# Patient Record
Sex: Female | Born: 1975 | Race: Black or African American | Hispanic: No | Marital: Single | Smoking: Never smoker
Health system: Southern US, Community
[De-identification: ages and names within clinical notes are randomized; demographics above are authoritative.]

## PROBLEM LIST (undated history)

## (undated) DIAGNOSIS — E669 Obesity, unspecified: Secondary | ICD-10-CM

## (undated) DIAGNOSIS — I10 Essential (primary) hypertension: Secondary | ICD-10-CM

## (undated) DIAGNOSIS — A048 Other specified bacterial intestinal infections: Secondary | ICD-10-CM

## (undated) DIAGNOSIS — N179 Acute kidney failure, unspecified: Secondary | ICD-10-CM

## (undated) HISTORY — PX: ENDOMETRIAL ABLATION W/ NOVASURE: SUR434

---

## 1998-09-11 ENCOUNTER — Encounter: Admission: RE | Admit: 1998-09-11 | Discharge: 1998-12-10 | Payer: Self-pay | Admitting: Family Medicine

## 1999-02-05 ENCOUNTER — Other Ambulatory Visit: Admission: RE | Admit: 1999-02-05 | Discharge: 1999-02-05 | Payer: Self-pay | Admitting: Obstetrics and Gynecology

## 2000-03-28 ENCOUNTER — Other Ambulatory Visit: Admission: RE | Admit: 2000-03-28 | Discharge: 2000-03-28 | Payer: Self-pay | Admitting: *Deleted

## 2000-08-29 HISTORY — PX: TUBAL LIGATION: SHX77

## 2000-11-12 ENCOUNTER — Inpatient Hospital Stay (HOSPITAL_COMMUNITY): Admission: AD | Admit: 2000-11-12 | Discharge: 2000-11-12 | Payer: Self-pay | Admitting: Obstetrics and Gynecology

## 2000-11-17 ENCOUNTER — Inpatient Hospital Stay (HOSPITAL_COMMUNITY): Admission: AD | Admit: 2000-11-17 | Discharge: 2000-11-17 | Payer: Self-pay | Admitting: Obstetrics and Gynecology

## 2000-11-19 ENCOUNTER — Observation Stay (HOSPITAL_COMMUNITY): Admission: AD | Admit: 2000-11-19 | Discharge: 2000-11-20 | Payer: Self-pay | Admitting: Obstetrics and Gynecology

## 2000-12-03 ENCOUNTER — Inpatient Hospital Stay (HOSPITAL_COMMUNITY): Admission: AD | Admit: 2000-12-03 | Discharge: 2000-12-05 | Payer: Self-pay | Admitting: Obstetrics and Gynecology

## 2001-01-10 ENCOUNTER — Other Ambulatory Visit: Admission: RE | Admit: 2001-01-10 | Discharge: 2001-01-10 | Payer: Self-pay | Admitting: Obstetrics and Gynecology

## 2001-01-30 ENCOUNTER — Ambulatory Visit (HOSPITAL_COMMUNITY): Admission: RE | Admit: 2001-01-30 | Discharge: 2001-01-30 | Payer: Self-pay | Admitting: Obstetrics and Gynecology

## 2002-01-17 ENCOUNTER — Other Ambulatory Visit: Admission: RE | Admit: 2002-01-17 | Discharge: 2002-01-17 | Payer: Self-pay | Admitting: Obstetrics and Gynecology

## 2003-02-21 ENCOUNTER — Other Ambulatory Visit: Admission: RE | Admit: 2003-02-21 | Discharge: 2003-02-21 | Payer: Self-pay | Admitting: Obstetrics and Gynecology

## 2005-04-20 ENCOUNTER — Ambulatory Visit (HOSPITAL_COMMUNITY): Admission: RE | Admit: 2005-04-20 | Discharge: 2005-04-20 | Payer: Self-pay | Admitting: Obstetrics and Gynecology

## 2005-04-20 ENCOUNTER — Encounter (INDEPENDENT_AMBULATORY_CARE_PROVIDER_SITE_OTHER): Payer: Self-pay | Admitting: Specialist

## 2010-02-26 ENCOUNTER — Encounter: Admission: RE | Admit: 2010-02-26 | Discharge: 2010-02-26 | Payer: Self-pay | Admitting: Family Medicine

## 2011-01-14 NOTE — Op Note (Signed)
[facility]  Patient:    [name] [name], [name] [name]                         MRN: [id number] Proc. Date: 01/30/01 Adm. Date:  [id number] Attending:  Lenoard Aden                           Operative Report  PREOPERATIVE DIAGNOSIS:       Desire for elective sterilization.  POSTOPERATIVE DIAGNOSIS:      Desire for elective sterilization.  OPERATION:                    Laparoscopic tubal ligation.  SURGEON:                      Lenoard Aden, M.D.  ASSISTANT:  ANESTHESIA:                   General anesthesia.  ESTIMATED BLOOD LOSS:         Less than 50 cc.  COMPLICATIONS:                None.  DRAINS:                       None.  COUNTS:                       Correct.  The patient to the recovery room in good condition.  FINDINGS:                     Normal size uterus, bilateral normal tubes and ovaries.  Normal liver and gallbladder.  DESCRIPTION OF PROCEDURE:     After being apprised of the risks of anesthesia, infection, bleeding, injury to abdominal organs with need for repair, failure rate of tubal ligation of 5 to 10 per 1000, and delayed versus immediate complications to include bowel and bladder injury, the patient was brought to the operating room where she was administered general anesthesia without complications.  She was prepped and draped in the usual sterile fashion.  Feet are placed in the yellow fin stirrups.  Examination under anesthesia reveals a small anteflexed uterus, no adnexal masses, Hulka tenaculum then placed per vagina.  Infraumbilical incision made with the scalpel after dilute Marcaine solution 10 cc placed.  Veress needle placed.  Opening pressure of -1 noted. Hanging drop test consistent with intraperitoneal entry.  5 liters of CO2 insufflated without difficulty after setting patient pressure to 25.  Trocar placed, atraumatic trocar entry visualized.  Pictures taken.  Visualization reveals normal tubes and ovaries as  noted above.  The patients pressure then reset to 15.  Kleppinger bipolar cautery entered after right tube traced out to the fimbriated end, cauterized down to resistance of 0 using bipolar cautery in three contiguous portions of the ampullary isthmic portion of the tube.  The same procedure was done on the left tube.  Both tubes divided using hook scissors.  Tubal lumens visualized.  Pictures taken.  Good hemostasis achieved.  CO2 released.  Trocar removed under direct visualization.  Incision closed using 0 Vicryl for deep suture.  Good hemostasis noted.  The instruments were removed from the vagina.  The patient tolerated the procedure well and was transferred to the recovery room in good condition. DD:  01/30/01 TD:  01/30/01 Job: [id number]  [id number]

## 2011-01-14 NOTE — Op Note (Signed)
NAMECORRENA, [name] [name]                ACCOUNT NO.:  [id number]   MEDICAL RECORD NO.:  [id number]          PATIENT TYPE:  AMB   LOCATION:  SDC                           FACILITY:  [facility]   PHYSICIAN:  Lenoard Aden, M.D.DATE OF BIRTH:  1975/10/12   DATE OF PROCEDURE:  04/20/2005  DATE OF DISCHARGE:                                 OPERATIVE REPORT   PREOPERATIVE DIAGNOSIS:  Dysmenorrhea and menorrhagia.   POSTOPERATIVE DIAGNOSIS:  Dysmenorrhea and menorrhagia.   PROCEDURE:  Diagnostic hysteroscopy and D&C, NovaSure endometrial ablation.   SURGEON:  Lenoard Aden, M.D.   ANESTHESIA:  General.   ESTIMATED BLOOD LOSS:  Less than 50 mL.   FLUID DEFICIT:  Less than 50 mL.   COMPLICATIONS:  None.   SPECIMENS:  Endometrial curettings.   DISPOSITION:  The patient to the recovery room in good condition.   DESCRIPTION OF PROCEDURE:  After being apprised of risks of anesthesia,  infection, bleeding, uterine perforation, and possible need for repair, the  patient was brought to the operating room where she was administered general  anesthesia without complications, and prepped and draped in the usual  sterile fashion, and catheterized until the bladder was empty.  At this  time, examination under anesthesia reveals an anteflexed, bulky uterus with  no adnexal masses.  Uterus sounds to 8 cm with cervical length of 2.5 cm.  Cavity length noted.  Hysteroscope placed after dilating up to #29 [facility]  dilator and visualization reveals some thickening of the endometrium on the  posterior wall of the cavity, bilateral normal tubal ostia.  At this time  D&C is performed with sharp endometrial curet.  Curettings are collected and  sent to pathology.  Rehysteroscopy reveals no evidence of thickened  endometrium, no evidence of focal lesions.  At this time, NovaSure ablation  is initiated for 77 seconds after the device is properly seated and a CO2  seal is checked.  At this time, good hemostasis  is noted.  The device is  removed.  Revisualization reveals a well-ablated endometrial cavity with no  evidence of perforation.  The patient tolerated the procedure well and is  awakened and transferred to recovery in good condition.     Lenoard Aden, M.D.  Electronically Signed    RJT/MEDQ  D:  04/20/2005  T:  04/20/2005  Job:  [id number]

## 2011-01-14 NOTE — H&P (Signed)
[facility]  Patient:    [name] [name], [name] [name]                         MRN: [id number] Adm. Date:  [id number] Attending:  Lenoard Aden CC:         [facility]   History and Physical  CHIEF COMPLAINT:              Chronic hypertension, presumed LGA.  HISTORY OF PRESENT ILLNESS:   The patient is a 35 year old black female, G3, P1, EDD of December 14, 2000, at 38+ weeks, with chronic hypertension on labetalol and estimated fetal weight greater than 90% percentile.  ALLERGIES:                    No known drug allergies.  MEDICATIONS:                  Labetalol and prenatal vitamins.  OBSTETRICAL HISTORY:          Uncomplicated 6-pound 9-ounce female born in 1995, and spontaneous pregnancy loss in 1997.  PAST MEDICAL HISTORY:         History of wisdom tooth removal.  No other medical or surgical hospitalization.  FAMILY HISTORY:               Diabetes, hypertension.  The patient does have a history of sickle cell trait.  Normal urinalysis. Father of the baby has a normal hemoglobin electrophoresis.  PRENATAL LABORATORY DATA:     Blood type O positive, Rh antibody negative. Rubella immune.  Hepatitis B surface antigen negative.  HIV nonreactive.  Pregnancy complicated by chronic hypertension, well controlled on labetalol, with normal interval growth.  Normal baseline 24-hour urine and CBC and PIH laboratories.  PHYSICAL EXAMINATION:  GENERAL:                      Well-developed, well-nourished black female in no apparent distress.  HEENT:                        Normal.  LUNGS:                        Clear.  HEART:                        Regular rhythm.  ABDOMEN:                      Soft, gravid, nontender.  Estimated fetal weight 8 pounds.  PELVIC:                       Cervix is 1 cm, 50%, vertex, -1.  Artificial rupture of membranes clear.  EXTREMITIES:                  DTRs 2+.  No evidence of clonus.  NEUROLOGIC:                    Intact.  IMPRESSION:                   1. A 38-1/2-week intrauterine pregnancy.                               2. Chronic hypertension.  3. Presumed large for gestational age.  PLAN:                         Proceed with Pitocin induction.  Watch blood pressure closely.  Check CBC, PIH laboratories.  Anticipate attempted vaginal delivery. DD:  12/03/00 TD:  12/03/00 Job: [id number]

## 2011-01-14 NOTE — H&P (Signed)
[facility]  Patient:    [name] [name], [name] [name]                         MRN: [id number] Adm. Date:  [id number] Disc. Date: [id number] Attending:  Lenoard Aden CC:         [facility]   History and Physical  CHIEF COMPLAINT:              Desire for elective sterilization.  HISTORY OF PRESENT ILLNESS:   The patient is a 35 year old, black female, G3, P2, status post a vaginal delivery in April of 2002, who presents for desirous of elective sterilization.  MEDICATIONS:                  Lotensin.  ALLERGIES:                    She has no known drug allergies.  OBSTETRICAL HISTORY:          Remarkable for two vaginal deliveries and one spontaneous pregnancy loss in 1997.  PAST MEDICAL HISTORY:         Her past medical history is remarkable for wisdom tooth removal and hypertension.  The patient also has a history of sickle cell trait.  FAMILY HISTORY:               Family history of diabetes and hypertension.  PRENATAL LABORATORY DATA:     Her blood type is O positive.  She had a pregnancy complicated by a well-controlled chronic hypertension.  SOCIAL HISTORY:               Noncontributory.  She is a non smoker, non drinker.  She denies domestic or physical violence.  PHYSICAL EXAMINATION:  GENERAL:                      On physical examination she is an obese, black female, in no apparent distress.  HEENT:                        Normal.  LUNGS:                        Clear.  HEART:                        Regular rate and rhythm.  ABDOMEN:                      Soft, obese, and nontender.  PELVIC:                       Examination reveals a small anteflexed uterus and no adnexal masses.  IMPRESSION:                   1. Chronic hypertension.                               2. Desirous of elective sterilization.  PLAN:                         Proceed with laparoscopic tubal sterilization. Risks of anesthesia, infection, bleeding, injury to  abdominal organs, need for repair is discussed.  Delayed versus immediate complications to include bowel or bladder injury  noted.  The patient acknowledges and desires to proceed. DD:  01/30/01 TD:  01/30/01 Job: [id number]

## 2011-01-14 NOTE — H&P (Signed)
[facility]  Patient:    [name] [name], [name] [name]                         MRN: [id number] Adm. Date:  [id number] Attending:  Lenoard Aden CC:         [facility]   History and Physical  CHIEF COMPLAINT:              Chronic hypertension with exacerbation, prodromal labor, and presumed large for gestational age at term.  HISTORY OF PRESENT ILLNESS:   Patient is a 35 year old black female G3, P1, EDD of December 14, 2000 at [redacted] weeks gestation who presents with afore mentioned issues.  MEDICATIONS:                  Labetalol 100 mg b.i.d.  ALLERGIES:                    No known drug allergies.  PAST MEDICAL HISTORY:         History of hypertension, wisdom tooth removal. No other medical or surgical hospitalizations.  PAST OBSTETRICAL HISTORY:     Uncomplicated vaginal delivery 6 pound 9 ounce female in 1995.  Spontaneous pregnancy loss at 6 weeks in 1997 without D&C.  PRENATAL LABORATORIES:        Blood type O+.  Rh antibody negative.  Rubella immune.  Hepatitis B surface antigen negative.  HIV nonreactive.  Sickle cell trait positive.  Father of the baby is negative.                                Patient has had reassuring surveillance in the form of twice weekly NSTs and normal growth on ultrasounds.  Most recent ultrasound performed on October 18, 2000 with estimated fetal weight in the 82nd percentile and borderline polyhydramnios.  PHYSICAL EXAMINATION  GENERAL:                      She is a well-developed, well-nourished black female in no apparent distress.  HEENT:                        Normal.  LUNGS:                        Clear.  HEART:                        Regular rate and rhythm.  ABDOMEN:                      Soft, gravid, and nontender.  PELVIC:                       Cervix is closed, 2.5 cm long, soft, vertex, and -2.  EXTREMITIES:                  DTRs 2+.  No evidence of clonus.  No cords are palpable.  LABORATORIES:                  CBC, PIH laboratories within normal limits.  IMPRESSION:                   1. Chronic hypertension at term.  2. Presumed large for gestational age.                               3. Multiple episodes of prodromal labor.  PLAN:                         Proceed with cervical ripening induction.  Risks of anesthesia, infection, bleeding, injury to abdominal organs, need for repair is discussed.  Possibility of failure of induction due to unfavorable cervix is discussed with the patient today.  We will reassess after attempts at cervical ripening and make determinations as long as her blood pressure and laboratory values are normal.  We will consider serial induction as needed. DD:  11/19/00 TD:  11/19/00 Job: [id number]

## 2011-01-14 NOTE — H&P (Signed)
[name] [name], [name] [name]                ACCOUNT NO.:  [id number]   MEDICAL RECORD NO.:  [id number]          PATIENT TYPE:  AMB   LOCATION:  SDC                           FACILITY:  [facility]   PHYSICIAN:  Lenoard Aden, M.D.DATE OF BIRTH:  03/18/1976   DATE OF ADMISSION:  04/20/2005  DATE OF DISCHARGE:                                HISTORY & PHYSICAL   CHIEF COMPLAINT:  Menorrhagia and dysmenorrhea.   HISTORY OF PRESENT ILLNESS:  The patient is a 35 year old African-American  female, G3, P2 status post vaginal delivery x2, status post tubal ligation  in 2002, he presents for management of dysmenorrhea and menorrhagia.   MEDICATIONS:  Antihypertensives in the form of Hyzaar.   She also has a history of LEEP in early 2006. She has a history of an  abnormal Pap smear with benign findings on follow-up and recommendations for  repeat Pap smear pending. She has no known drug allergies.   FAMILY HISTORY:  Contributory for diabetes and hypertension. She also has a  medical history for sickle cell trait and a surgical history of a wisdom  tooth removal and medical history of hypertension.   SOCIAL HISTORY:  Noncontributory. She is a nonsmoker and nondrinker. She  denies domestic and physical violence.   PHYSICAL EXAMINATION:  GENERAL:  She is a well-developed, well-nourished  African-American female in no acute distress.  HEENT:  Normal.  LUNGS:  Clear.  HEART:  Regular rate and rhythm.  ABDOMEN:  Soft and nontender.  PELVIC:  Reveals an anteflexed uterus and no adnexal masses.  EXTREMITIES:  No cords.  NEUROLOGICAL:  Nonfocal.   IMPRESSION:  Dysmenorrhea and menorrhagia for surgical therapy.   PLAN:  Proceed with diagnostic hysteroscopy, endometrial ablation, D&C.  Risks of anesthesia, infection, bleeding, injury to abdominal organs, and  need for repair was discussed. Delayed versus immediate complications to  include bowel and bladder injury are noted. The patient acknowledges and  wishes to proceed.      Lenoard Aden, M.D.  Electronically Signed     RJT/MEDQ  D:  04/19/2005  T:  04/20/2005  Job:  [id number]   cc:   Ma Hillock

## 2014-06-29 DIAGNOSIS — A048 Other specified bacterial intestinal infections: Secondary | ICD-10-CM

## 2014-06-29 HISTORY — DX: Other specified bacterial intestinal infections: A04.8

## 2014-06-29 HISTORY — PX: LAPAROSCOPIC GASTRIC SLEEVE RESECTION: SHX5895

## 2014-08-29 HISTORY — PX: ESOPHAGOGASTRODUODENOSCOPY: SHX1529

## 2014-09-29 DIAGNOSIS — N179 Acute kidney failure, unspecified: Secondary | ICD-10-CM

## 2014-09-29 HISTORY — DX: Acute kidney failure, unspecified: N17.9

## 2014-11-23 ENCOUNTER — Emergency Department (HOSPITAL_COMMUNITY): Payer: BLUE CROSS/BLUE SHIELD

## 2014-11-23 ENCOUNTER — Emergency Department (HOSPITAL_COMMUNITY)
Admission: EM | Admit: 2014-11-23 | Discharge: 2014-11-23 | Disposition: A | Discharge status: Home / Self Care | Payer: BLUE CROSS/BLUE SHIELD | Attending: Emergency Medicine | Admitting: Emergency Medicine

## 2014-11-23 ENCOUNTER — Encounter (HOSPITAL_COMMUNITY): Payer: Self-pay | Admitting: Emergency Medicine

## 2014-11-23 DIAGNOSIS — Z880 Allergy status to penicillin: Secondary | ICD-10-CM | POA: Diagnosis not present

## 2014-11-23 DIAGNOSIS — R000 Tachycardia, unspecified: Secondary | ICD-10-CM | POA: Insufficient documentation

## 2014-11-23 DIAGNOSIS — Z3202 Encounter for pregnancy test, result negative: Secondary | ICD-10-CM | POA: Diagnosis not present

## 2014-11-23 DIAGNOSIS — E860 Dehydration: Secondary | ICD-10-CM | POA: Insufficient documentation

## 2014-11-23 DIAGNOSIS — R110 Nausea: Secondary | ICD-10-CM | POA: Diagnosis present

## 2014-11-23 DIAGNOSIS — R531 Weakness: Secondary | ICD-10-CM

## 2014-11-23 DIAGNOSIS — Z79899 Other long term (current) drug therapy: Secondary | ICD-10-CM | POA: Insufficient documentation

## 2014-11-23 DIAGNOSIS — I10 Essential (primary) hypertension: Secondary | ICD-10-CM | POA: Insufficient documentation

## 2014-11-23 HISTORY — DX: Essential (primary) hypertension: I10

## 2014-11-23 LAB — RAPID URINE DRUG SCREEN, HOSP PERFORMED
AMPHETAMINES: NOT DETECTED
BARBITURATES: NOT DETECTED
BENZODIAZEPINES: NOT DETECTED
Cocaine: NOT DETECTED
Opiates: NOT DETECTED
Tetrahydrocannabinol: NOT DETECTED

## 2014-11-23 LAB — URINALYSIS, ROUTINE W REFLEX MICROSCOPIC
BILIRUBIN URINE: NEGATIVE
GLUCOSE, UA: NEGATIVE mg/dL
Hgb urine dipstick: NEGATIVE
KETONES UR: NEGATIVE mg/dL
LEUKOCYTES UA: NEGATIVE
Nitrite: NEGATIVE
PROTEIN: NEGATIVE mg/dL
Specific Gravity, Urine: 1.015 (ref 1.005–1.030)
UROBILINOGEN UA: 1 mg/dL (ref 0.0–1.0)
pH: 6 (ref 5.0–8.0)

## 2014-11-23 LAB — I-STAT CHEM 8, ED
BUN: 29 mg/dL — AB (ref 6–23)
CHLORIDE: 107 mmol/L (ref 96–112)
CREATININE: 0.8 mg/dL (ref 0.50–1.10)
Calcium, Ion: 1.2 mmol/L (ref 1.12–1.23)
GLUCOSE: 106 mg/dL — AB (ref 70–99)
HCT: 44 % (ref 36.0–46.0)
HEMOGLOBIN: 15 g/dL (ref 12.0–15.0)
POTASSIUM: 3.2 mmol/L — AB (ref 3.5–5.1)
SODIUM: 149 mmol/L — AB (ref 135–145)
TCO2: 27 mmol/L (ref 0–100)

## 2014-11-23 LAB — POC URINE PREG, ED: PREG TEST UR: NEGATIVE

## 2014-11-23 LAB — I-STAT TROPONIN, ED: Troponin i, poc: 0.01 ng/mL (ref 0.00–0.08)

## 2014-11-23 LAB — BASIC METABOLIC PANEL
ANION GAP: 11 (ref 5–15)
BUN: 24 mg/dL — ABNORMAL HIGH (ref 6–23)
CALCIUM: 9.9 mg/dL (ref 8.4–10.5)
CO2: 28 mmol/L (ref 19–32)
CREATININE: 0.76 mg/dL (ref 0.50–1.10)
Chloride: 108 mmol/L (ref 96–112)
Glucose, Bld: 114 mg/dL — ABNORMAL HIGH (ref 70–99)
POTASSIUM: 2.8 mmol/L — AB (ref 3.5–5.1)
Sodium: 147 mmol/L — ABNORMAL HIGH (ref 135–145)

## 2014-11-23 LAB — DIFFERENTIAL
Basophils Absolute: 0.1 10*3/uL (ref 0.0–0.1)
Basophils Relative: 1 % (ref 0–1)
Eosinophils Absolute: 0.1 10*3/uL (ref 0.0–0.7)
Eosinophils Relative: 1 % (ref 0–5)
LYMPHS ABS: 3.2 10*3/uL (ref 0.7–4.0)
LYMPHS PCT: 33 % (ref 12–46)
MONOS PCT: 12 % (ref 3–12)
Monocytes Absolute: 1.1 10*3/uL — ABNORMAL HIGH (ref 0.1–1.0)
Neutro Abs: 5.2 10*3/uL (ref 1.7–7.7)
Neutrophils Relative %: 53 % (ref 43–77)

## 2014-11-23 LAB — CBC
HEMATOCRIT: 39.8 % (ref 36.0–46.0)
HEMOGLOBIN: 13.5 g/dL (ref 12.0–15.0)
MCH: 27.8 pg (ref 26.0–34.0)
MCHC: 33.9 g/dL (ref 30.0–36.0)
MCV: 82.1 fL (ref 78.0–100.0)
PLATELETS: 294 10*3/uL (ref 150–400)
RBC: 4.85 MIL/uL (ref 3.87–5.11)
RDW: 16.7 % — AB (ref 11.5–15.5)
WBC: 9.5 10*3/uL (ref 4.0–10.5)

## 2014-11-23 LAB — ETHANOL

## 2014-11-23 LAB — PROTIME-INR
INR: 1.13 (ref 0.00–1.49)
PROTHROMBIN TIME: 14.6 s (ref 11.6–15.2)

## 2014-11-23 LAB — LIPASE, BLOOD: LIPASE: 55 U/L (ref 11–59)

## 2014-11-23 LAB — APTT: aPTT: 34 seconds (ref 24–37)

## 2014-11-23 MED ORDER — POTASSIUM CHLORIDE CRYS ER 20 MEQ PO TBCR
40.0000 meq | EXTENDED_RELEASE_TABLET | Freq: Once | ORAL | Status: AC
  Administered 2014-11-23: 40 meq via ORAL
  Filled 2014-11-23: qty 2

## 2014-11-23 MED ORDER — SODIUM CHLORIDE 0.9 % IV BOLUS (SEPSIS)
1000.0000 mL | Freq: Once | INTRAVENOUS | Status: AC
  Administered 2014-11-23: 1000 mL via INTRAVENOUS

## 2014-11-23 MED ORDER — POTASSIUM CHLORIDE CRYS ER 20 MEQ PO TBCR: 20.0000 meq | EXTENDED_RELEASE_TABLET | Freq: Two times a day (BID) | ORAL | Status: DC

## 2014-11-23 MED ORDER — ONDANSETRON HCL 8 MG PO TABS: 8.0000 mg | ORAL_TABLET | Freq: Three times a day (TID) | ORAL | Status: DC | PRN

## 2014-11-23 NOTE — ED Notes (Signed)
Pt c/o weakness and nausea x several months.  Pt states that she has been seeing a doctor and her "numbers were off".  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  December is when things started happening.  Has been through CT scans, endoscopies, colonoscopies, etc.  Has been told that nothing is wrong.

## 2014-11-23 NOTE — ED Provider Notes (Signed)
CSN: [id number]     Arrival date & time 11/23/14  1615 History   First MD Initiated Contact with Patient 11/23/14 1635     Chief Complaint  Patient presents with  . Weakness  . Nausea     (Consider location/radiation/quality/duration/timing/severity/associated sxs/prior Treatment) HPI History is obtained from patient and from patient's husband. Patient has been nauseated and with diminished oral intake since having had gastric bypass surgery November 2015. She presents today as she has become progressively more weak  And somewhat  confusedover the past 2 days. She denies pain anywhere. She is not nauseated presently. She denies abdominal pain. Denies headache. No chest pain. No other associated symptoms. [facility] department [facility]/22/2014, and released. Her husband reports that almost fell as she was leaving the hospital. No other treatment prior to coming here. Past Medical History  Diagnosis Date  . Hypertension    Past Surgical History  Procedure Laterality Date  . Laparoscopic gastric sleeve resection     History reviewed. No pertinent family history. History  Substance Use Topics  . Smoking status: Never Smoker   . Smokeless tobacco: Not on file  . Alcohol Use: No   OB History    No data available     Review of Systems  Gastrointestinal: Positive for nausea.  Neurological: Positive for weakness.       Generalized weakness      Allergies  Levaquin and Penicillins  Home Medications   Prior to Admission medications   Medication Sig Start Date End Date Taking? Authorizing Provider  losartan-hydrochlorothiazide (HYZAAR) 50-12.5 MG per tablet Take 1 tablet by mouth daily. 11/01/14  Yes Historical Provider, MD   BP 153/115 mmHg  Pulse 124  Temp(Src) 98.2 F (36.8 C) (Oral)  Resp 23  SpO2 99% Physical Exam  Constitutional: She is oriented to person, place, and time. She appears well-developed and well-nourished.  HENT:  Head: Normocephalic  and atraumatic.  Eyes: Conjunctivae are normal. Pupils are equal, round, and reactive to light.  Neck: Neck supple. No tracheal deviation present. No thyromegaly present.  Cardiovascular: Regular rhythm.   No murmur heard. Mildly tachycardic  Pulmonary/Chest: Effort normal and breath sounds normal.  Abdominal: Soft. Bowel sounds are normal. She exhibits no distension. There is no tenderness.  Musculoskeletal: Normal range of motion. She exhibits no edema or tenderness.  Neurological: She is alert and oriented to person, place, and time. She has normal reflexes. No cranial nerve deficit. Coordination normal.  Not lightheaded on standing. Gait is broad-based and unsteady  Skin: Skin is warm and dry. No rash noted.  Psychiatric: She has a normal mood and affect.  Nursing note and vitals reviewed.   ED Course  Procedures (including critical care time) Labs Review Labs Reviewed  CBC  BASIC METABOLIC PANEL  POC URINE PREG, ED    Imaging Review No results found.   EKG Interpretation   Date/Time:  Sunday November 23 2014 16:43:22 EDT Ventricular Rate:  103 PR Interval:  137 QRS Duration: 68 QT Interval:  410 QTC Calculation: 537 R Axis:   0 Text Interpretation:  Sinus tachycardia Borderline T abnormalities,  anterior leads Prolonged QT interval No old tracing to compare Confirmed  by Gentry, Matthew ([id number]) on 11/23/2014 4:45:35 PM     19 55 PM patient's gait is normal and she is alert and oriented 3 Glasgow Coma Score 15 she complains of mild lightheadedness on standing.  22:45 PM patient asymptomatic after having been treated with 2 L  of normal saline intravenously. She ate part of a meal in the emergency department. Results for orders placed or performed during the hospital encounter of 11/23/14  CBC  (at [facility])  Result Value Ref Range   WBC 9.5 4.0 - 10.5 K/uL   RBC 4.85 3.87 - 5.11 MIL/uL   Hemoglobin 13.5 12.0 - 15.0 g/dL   HCT 16.1 09.6 - 04.5 %   MCV  82.1 78.0 - 100.0 fL   MCH 27.8 26.0 - 34.0 pg   MCHC 33.9 30.0 - 36.0 g/dL   RDW 40.9 (H) 81.1 - 91.4 %   Platelets 294 150 - 400 K/uL  Basic metabolic panel  (at [facility])  Result Value Ref Range   Sodium 147 (H) 135 - 145 mmol/L   Potassium 2.8 (L) 3.5 - 5.1 mmol/L   Chloride 108 96 - 112 mmol/L   CO2 28 19 - 32 mmol/L   Glucose, Bld 114 (H) 70 - 99 mg/dL   BUN 24 (H) 6 - 23 mg/dL   Creatinine, Ser 7.82 0.50 - 1.10 mg/dL   Calcium 9.9 8.4 - 95.6 mg/dL   GFR calc non Af Amer >90 >90 mL/min   GFR calc Af Amer >90 >90 mL/min   Anion gap 11 5 - 15  Ethanol  Result Value Ref Range   Alcohol, Ethyl (B) <5 0 - 9 mg/dL  Protime-INR  Result Value Ref Range   Prothrombin Time 14.6 11.6 - 15.2 seconds   INR 1.13 0.00 - 1.49  APTT  Result Value Ref Range   aPTT 34 24 - 37 seconds  Urine Drug Screen  Result Value Ref Range   Opiates NONE DETECTED NONE DETECTED   Cocaine NONE DETECTED NONE DETECTED   Benzodiazepines NONE DETECTED NONE DETECTED   Amphetamines NONE DETECTED NONE DETECTED   Tetrahydrocannabinol NONE DETECTED NONE DETECTED   Barbiturates NONE DETECTED NONE DETECTED  Urinalysis, Routine w reflex microscopic  Result Value Ref Range   Color, Urine YELLOW YELLOW   APPearance CLEAR CLEAR   Specific Gravity, Urine 1.015 1.005 - 1.030   pH 6.0 5.0 - 8.0   Glucose, UA NEGATIVE NEGATIVE mg/dL   Hgb urine dipstick NEGATIVE NEGATIVE   Bilirubin Urine NEGATIVE NEGATIVE   Ketones, ur NEGATIVE NEGATIVE mg/dL   Protein, ur NEGATIVE NEGATIVE mg/dL   Urobilinogen, UA 1.0 0.0 - 1.0 mg/dL   Nitrite NEGATIVE NEGATIVE   Leukocytes, UA NEGATIVE NEGATIVE  Lipase, blood  Result Value Ref Range   Lipase 55 11 - 59 U/L  Differential  Result Value Ref Range   Neutrophils Relative % 53 43 - 77 %   Neutro Abs 5.2 1.7 - 7.7 K/uL   Lymphocytes Relative 33 12 - 46 %   Lymphs Abs 3.2 0.7 - 4.0 K/uL   Monocytes Relative 12 3 - 12 %   Monocytes Absolute 1.1 (H) 0.1 - 1.0 K/uL    Eosinophils Relative 1 0 - 5 %   Eosinophils Absolute 0.1 0.0 - 0.7 K/uL   Basophils Relative 1 0 - 1 %   Basophils Absolute 0.1 0.0 - 0.1 K/uL  POC Urine Preg, ED (if patient is pre-menopausal female) NOT at [facility]  Result Value Ref Range   Preg Test, Ur NEGATIVE NEGATIVE  I-Stat Chem 8, ED  Result Value Ref Range   Sodium 149 (H) 135 - 145 mmol/L   Potassium 3.2 (L) 3.5 - 5.1 mmol/L   Chloride 107 96 - 112 mmol/L  BUN 29 (H) 6 - 23 mg/dL   Creatinine, Ser 7.84 0.50 - 1.10 mg/dL   Glucose, Bld 696 (H) 70 - 99 mg/dL   Calcium, Ion 2.95 1.12 - 1.23 mmol/L   TCO2 27 0 - 100 mmol/L   Hemoglobin 15.0 12.0 - 15.0 g/dL   HCT 28.4 13.2 - 44.0 %  I-Stat Troponin, ED (not at [facility])  Result Value Ref Range   Troponin i, poc 0.01 0.00 - 0.08 ng/mL   Comment 3           Mr Brain Wo Contrast  11/23/2014   CLINICAL DATA:  Weakness and nausea of several months duration.  EXAM: MRI HEAD WITHOUT CONTRAST  TECHNIQUE: Multiplanar, multiecho pulse sequences of the brain and surrounding structures were obtained without intravenous contrast.  COMPARISON:  None.  FINDINGS: Diffusion imaging does not show any acute or subacute infarction. The brainstem and cerebellum are normal. The cerebral hemispheres are normal except for a few punctate foci of T2 and FLAIR signal in the subcortical white matter, not likely significant. No cortical or large vessel territory abnormality. No mass lesion, hemorrhage, hydrocephalus or extra-axial collection. No pituitary mass. No inflammatory sinus disease. No skull or skullbase lesion. Major vessels at the base of the brain show flow.  IMPRESSION: No significant finding. No cause of the presenting symptoms is identified. Normal study except for a few punctate white matter foci not likely of clinical relevance.   Electronically Signed   By: Paulina Fusi M.D.   On: 11/23/2014 19:08    MDM  MRI scan is not consistent with stroke. Gait improved after treatment with intravenous  hydration. Clinically patient mildly dehydrated and that she has decreased oral intake. Patient encouraged to drink 4 ounces of Gatorade every 4 hours. She'll be given a prescription for K-Dur and zofran She is to follow-up with Dr.Andy this week. Suggest blood pressure recheck and serum potassium recheck Diagnosis #1 dehydration #2 hypokalemia #3 hypernatremia #4 hypertension  Final diagnoses:  None        Doug Sou, MD 11/23/14 2251

## 2014-11-23 NOTE — Discharge Instructions (Signed)
Dehydration, Adult Drink 4 ounces of Gatorade every 4 hours while awake. Your blood potassium today was slightly low at 3.2. Blood pressure was elevated at 160/102. Call Dr. Mardelle MatteAndy tomorrow to arrange to be seen in the office this week. Your blood potassium and blood pressure should be rechecked. Dehydration means your body does not have as much fluid as it needs. Your kidneys, brain, and heart will not work properly without the right amount of fluids and salt.  HOME CARE  Ask your doctor how to replace body fluid losses (rehydrate).  Drink enough fluids to keep your pee (urine) clear or pale yellow.  Drink small amounts of fluids often if you feel sick to your stomach (nauseous) or throw up (vomit).  Eat like you normally do.  Avoid:  Foods or drinks high in sugar.  Bubbly (carbonated) drinks.  Juice.  Very hot or cold fluids.  Drinks with caffeine.  Fatty, greasy foods.  Alcohol.  Tobacco.  Eating too much.  Gelatin desserts.  Wash your hands to avoid spreading germs (bacteria, viruses).  Only take medicine as told by your doctor.  Keep all doctor visits as told. GET HELP RIGHT AWAY IF:   You cannot drink something without throwing up.  You get worse even with treatment.  Your vomit has blood in it or looks greenish.  Your poop (stool) has blood in it or looks black and tarry.  You have not peed in 6 to 8 hours.  You pee a small amount of very dark pee.  You have a fever.  You pass out (faint).  You have belly (abdominal) pain that gets worse or stays in one spot (localizes).  You have a rash, stiff neck, or bad headache.  You get easily annoyed, sleepy, or are hard to wake up.  You feel weak, dizzy, or very thirsty. MAKE SURE YOU:   Understand these instructions.  Will watch your condition.  Will get help right away if you are not doing well or get worse. Document Released: 06/11/2009 Document Revised: 11/07/2011 Document Reviewed:  04/04/2011 [facility] Patient Information 2015 [place], [place]. This information is not intended to replace advice given to you by your health care provider. Make sure you discuss any questions you have with your health care provider.

## 2014-11-23 NOTE — ED Notes (Signed)
Patient aware of sample needed.  

## 2014-11-23 NOTE — ED Notes (Signed)
Patient transported to MRI 

## 2014-11-26 ENCOUNTER — Inpatient Hospital Stay (HOSPITAL_COMMUNITY)
Admission: EM | Admit: 2014-11-26 | Discharge: 2014-12-08 | DRG: 091 | Disposition: A | Discharge status: Skilled Nursing Facility | Payer: BLUE CROSS/BLUE SHIELD | Attending: Internal Medicine | Admitting: Internal Medicine

## 2014-11-26 ENCOUNTER — Emergency Department (HOSPITAL_COMMUNITY): Payer: BLUE CROSS/BLUE SHIELD

## 2014-11-26 ENCOUNTER — Encounter (HOSPITAL_COMMUNITY): Payer: Self-pay

## 2014-11-26 DIAGNOSIS — R627 Adult failure to thrive: Secondary | ICD-10-CM | POA: Diagnosis present

## 2014-11-26 DIAGNOSIS — F4323 Adjustment disorder with mixed anxiety and depressed mood: Secondary | ICD-10-CM | POA: Diagnosis present

## 2014-11-26 DIAGNOSIS — K828 Other specified diseases of gallbladder: Secondary | ICD-10-CM | POA: Diagnosis present

## 2014-11-26 DIAGNOSIS — K5900 Constipation, unspecified: Secondary | ICD-10-CM | POA: Diagnosis present

## 2014-11-26 DIAGNOSIS — R7989 Other specified abnormal findings of blood chemistry: Secondary | ICD-10-CM

## 2014-11-26 DIAGNOSIS — R410 Disorientation, unspecified: Secondary | ICD-10-CM | POA: Insufficient documentation

## 2014-11-26 DIAGNOSIS — I10 Essential (primary) hypertension: Secondary | ICD-10-CM | POA: Diagnosis present

## 2014-11-26 DIAGNOSIS — E876 Hypokalemia: Secondary | ICD-10-CM | POA: Diagnosis present

## 2014-11-26 DIAGNOSIS — F419 Anxiety disorder, unspecified: Secondary | ICD-10-CM

## 2014-11-26 DIAGNOSIS — Z881 Allergy status to other antibiotic agents status: Secondary | ICD-10-CM | POA: Diagnosis not present

## 2014-11-26 DIAGNOSIS — G9340 Encephalopathy, unspecified: Secondary | ICD-10-CM | POA: Diagnosis present

## 2014-11-26 DIAGNOSIS — R52 Pain, unspecified: Secondary | ICD-10-CM

## 2014-11-26 DIAGNOSIS — R945 Abnormal results of liver function studies: Secondary | ICD-10-CM

## 2014-11-26 DIAGNOSIS — G4700 Insomnia, unspecified: Secondary | ICD-10-CM | POA: Diagnosis present

## 2014-11-26 DIAGNOSIS — Z9884 Bariatric surgery status: Secondary | ICD-10-CM

## 2014-11-26 DIAGNOSIS — G629 Polyneuropathy, unspecified: Secondary | ICD-10-CM | POA: Diagnosis present

## 2014-11-26 DIAGNOSIS — R531 Weakness: Secondary | ICD-10-CM

## 2014-11-26 DIAGNOSIS — R270 Ataxia, unspecified: Secondary | ICD-10-CM | POA: Diagnosis not present

## 2014-11-26 DIAGNOSIS — K8020 Calculus of gallbladder without cholecystitis without obstruction: Secondary | ICD-10-CM | POA: Diagnosis present

## 2014-11-26 DIAGNOSIS — Z903 Acquired absence of stomach [part of]: Secondary | ICD-10-CM | POA: Diagnosis present

## 2014-11-26 DIAGNOSIS — E669 Obesity, unspecified: Secondary | ICD-10-CM

## 2014-11-26 DIAGNOSIS — Z880 Allergy status to penicillin: Secondary | ICD-10-CM | POA: Diagnosis not present

## 2014-11-26 DIAGNOSIS — K819 Cholecystitis, unspecified: Secondary | ICD-10-CM

## 2014-11-26 DIAGNOSIS — R29898 Other symptoms and signs involving the musculoskeletal system: Secondary | ICD-10-CM

## 2014-11-26 DIAGNOSIS — K8012 Calculus of gallbladder with acute and chronic cholecystitis without obstruction: Secondary | ICD-10-CM

## 2014-11-26 DIAGNOSIS — R000 Tachycardia, unspecified: Secondary | ICD-10-CM | POA: Insufficient documentation

## 2014-11-26 HISTORY — DX: Other specified bacterial intestinal infections: A04.8

## 2014-11-26 HISTORY — DX: Obesity, unspecified: E66.9

## 2014-11-26 HISTORY — DX: Acute kidney failure, unspecified: N17.9

## 2014-11-26 LAB — CBC WITH DIFFERENTIAL/PLATELET
Basophils Absolute: 0 10*3/uL (ref 0.0–0.1)
Basophils Relative: 0 % (ref 0–1)
EOS ABS: 0.1 10*3/uL (ref 0.0–0.7)
EOS PCT: 2 % (ref 0–5)
HCT: 38.6 % (ref 36.0–46.0)
Hemoglobin: 13.1 g/dL (ref 12.0–15.0)
LYMPHS PCT: 27 % (ref 12–46)
Lymphs Abs: 1.9 10*3/uL (ref 0.7–4.0)
MCH: 27.7 pg (ref 26.0–34.0)
MCHC: 33.9 g/dL (ref 30.0–36.0)
MCV: 81.6 fL (ref 78.0–100.0)
Monocytes Absolute: 0.8 10*3/uL (ref 0.1–1.0)
Monocytes Relative: 11 % (ref 3–12)
NEUTROS PCT: 60 % (ref 43–77)
Neutro Abs: 4.2 10*3/uL (ref 1.7–7.7)
PLATELETS: 274 10*3/uL (ref 150–400)
RBC: 4.73 MIL/uL (ref 3.87–5.11)
RDW: 17.1 % — ABNORMAL HIGH (ref 11.5–15.5)
WBC: 6.9 10*3/uL (ref 4.0–10.5)

## 2014-11-26 LAB — COMPREHENSIVE METABOLIC PANEL
ALT: 520 U/L — ABNORMAL HIGH (ref 0–35)
ANION GAP: 6 (ref 5–15)
AST: 223 U/L — AB (ref 0–37)
Albumin: 3.4 g/dL — ABNORMAL LOW (ref 3.5–5.2)
Alkaline Phosphatase: 223 U/L — ABNORMAL HIGH (ref 39–117)
BILIRUBIN TOTAL: 1.8 mg/dL — AB (ref 0.3–1.2)
BUN: 17 mg/dL (ref 6–23)
CALCIUM: 9.8 mg/dL (ref 8.4–10.5)
CHLORIDE: 107 mmol/L (ref 96–112)
CO2: 32 mmol/L (ref 19–32)
Creatinine, Ser: 0.88 mg/dL (ref 0.50–1.10)
GFR, EST NON AFRICAN AMERICAN: 81 mL/min — AB (ref >90)
GLUCOSE: 110 mg/dL — AB (ref 70–99)
Potassium: 3.2 mmol/L — ABNORMAL LOW (ref 3.5–5.1)
SODIUM: 145 mmol/L (ref 135–145)
Total Protein: 7.5 g/dL (ref 6.0–8.3)

## 2014-11-26 LAB — LIPASE, BLOOD: Lipase: 48 U/L (ref 11–59)

## 2014-11-26 MED ORDER — SODIUM CHLORIDE 0.9 % IV BOLUS (SEPSIS)
1000.0000 mL | Freq: Once | INTRAVENOUS | Status: AC
  Administered 2014-11-26: 1000 mL via INTRAVENOUS

## 2014-11-26 MED ORDER — SODIUM CHLORIDE 0.9 % IV SOLN
INTRAVENOUS | Status: DC
  Administered 2014-11-26: 18:00:00 via INTRAVENOUS
  Administered 2014-11-27: 1000 mL via INTRAVENOUS
  Administered 2014-11-27: 04:00:00 via INTRAVENOUS

## 2014-11-26 MED ORDER — POTASSIUM CHLORIDE 10 MEQ/100ML IV SOLN
10.0000 meq | Freq: Once | INTRAVENOUS | Status: AC
  Administered 2014-11-26: 10 meq via INTRAVENOUS
  Filled 2014-11-26: qty 100

## 2014-11-26 MED ORDER — MORPHINE SULFATE 2 MG/ML IJ SOLN: 2.0000 mg | INTRAMUSCULAR | Status: DC | PRN

## 2014-11-26 MED ORDER — LOSARTAN POTASSIUM-HCTZ 50-12.5 MG PO TABS: 1.0000 | ORAL_TABLET | Freq: Every day | ORAL | Status: DC

## 2014-11-26 MED ORDER — IOHEXOL 300 MG/ML  SOLN: 25.0000 mL | Freq: Once | INTRAMUSCULAR | Status: AC | PRN

## 2014-11-26 MED ORDER — SODIUM CHLORIDE 0.9 % IV BOLUS (SEPSIS)
500.0000 mL | Freq: Once | INTRAVENOUS | Status: AC
  Administered 2014-11-26: 500 mL via INTRAVENOUS

## 2014-11-26 MED ORDER — ONDANSETRON HCL 4 MG PO TABS: 4.0000 mg | ORAL_TABLET | Freq: Four times a day (QID) | ORAL | Status: DC | PRN

## 2014-11-26 MED ORDER — HYDROCHLOROTHIAZIDE 12.5 MG PO CAPS
12.5000 mg | ORAL_CAPSULE | Freq: Every day | ORAL | Status: DC
Start: 2014-11-26 — End: 2014-11-27
  Administered 2014-11-26 – 2014-11-27 (×2): 12.5 mg via ORAL
  Filled 2014-11-26 (×2): qty 1

## 2014-11-26 MED ORDER — HEPARIN SODIUM (PORCINE) 5000 UNIT/ML IJ SOLN
5000.0000 [IU] | Freq: Three times a day (TID) | INTRAMUSCULAR | Status: DC
  Administered 2014-11-26 – 2014-12-07 (×30): 5000 [IU] via SUBCUTANEOUS
  Filled 2014-11-26 (×37): qty 1

## 2014-11-26 MED ORDER — ONDANSETRON HCL 4 MG/2ML IJ SOLN
4.0000 mg | Freq: Four times a day (QID) | INTRAMUSCULAR | Status: DC | PRN
Start: 2014-11-26 — End: 2014-12-08
  Administered 2014-12-02 – 2014-12-07 (×2): 4 mg via INTRAVENOUS
  Filled 2014-11-26 (×2): qty 2

## 2014-11-26 MED ORDER — LABETALOL HCL 5 MG/ML IV SOLN
20.0000 mg | Freq: Once | INTRAVENOUS | Status: AC
  Administered 2014-11-26: 20 mg via INTRAVENOUS
  Filled 2014-11-26: qty 4

## 2014-11-26 MED ORDER — LOSARTAN POTASSIUM 50 MG PO TABS
50.0000 mg | ORAL_TABLET | Freq: Every day | ORAL | Status: DC
  Administered 2014-11-26 – 2014-12-08 (×13): 50 mg via ORAL
  Filled 2014-11-26 (×13): qty 1

## 2014-11-26 NOTE — ED Notes (Signed)
Dr. zammit aware of high BP, no new orders

## 2014-11-26 NOTE — ED Notes (Signed)
Pt ambulated with unsteady gait with assistance.

## 2014-11-26 NOTE — Progress Notes (Signed)
Pt admitted to the unit at 1750. Pt mental status is A&Ox4. Pt oriented to room, staff, and call bell. Skin is intact. Full assessment charted in [facility]. Call bell within reach. Visitor guidelines reviewed w/ pt and/or family.

## 2014-11-26 NOTE — ED Provider Notes (Signed)
CSN: [id number]     Arrival date & time 11/26/14  1013 History   First MD Initiated Contact with Patient 11/26/14 1018     Chief Complaint  Patient presents with  . Fatigue     (Consider location/radiation/quality/duration/timing/severity/associated sxs/prior Treatment) Patient is a 39 y.o. female presenting with weakness. The history is provided by the patient (the pt complains of weakness for months and ataxia for one week).  Weakness This is a new problem. The current episode started more than 1 week ago. The problem occurs constantly. The problem has not changed since onset.Pertinent negatives include no chest pain, no abdominal pain and no headaches. Nothing aggravates the symptoms. Nothing relieves the symptoms.    Past Medical History  Diagnosis Date  . Hypertension    Past Surgical History  Procedure Laterality Date  . Laparoscopic gastric sleeve resection     History reviewed. No pertinent family history. History  Substance Use Topics  . Smoking status: Never Smoker   . Smokeless tobacco: Not on file  . Alcohol Use: No   OB History    No data available     Review of Systems  Constitutional: Negative for appetite change and fatigue.  HENT: Negative for congestion, ear discharge and sinus pressure.   Eyes: Negative for discharge.  Respiratory: Negative for cough.   Cardiovascular: Negative for chest pain.  Gastrointestinal: Negative for abdominal pain and diarrhea.  Genitourinary: Negative for frequency and hematuria.  Musculoskeletal: Negative for back pain.  Skin: Negative for rash.  Neurological: Positive for weakness. Negative for seizures and headaches.       Ataxia  Psychiatric/Behavioral: Negative for hallucinations.      Allergies  Levaquin and Penicillins  Home Medications   Prior to Admission medications   Medication Sig Start Date End Date Taking? Authorizing Provider  losartan-hydrochlorothiazide (HYZAAR) 50-12.5 MG per tablet Take 1 tablet  by mouth daily. 11/01/14  Yes Historical Provider, MD  ondansetron (ZOFRAN) 8 MG tablet Take 1 tablet (8 mg total) by mouth every 8 (eight) hours as needed for nausea. Patient not taking: Reported on 11/26/2014 11/23/14   Doug Sou, MD  potassium chloride SA (K-DUR,KLOR-CON) 20 MEQ tablet Take 1 tablet (20 mEq total) by mouth 2 (two) times daily. Patient not taking: Reported on 11/26/2014 11/23/14   Doug Sou, MD   BP 137/101 mmHg  Pulse 91  Temp(Src) 98.3 F (36.8 C) (Oral)  Resp 16  SpO2 100%  LMP  (LMP Unknown) Physical Exam  Constitutional: She is oriented to person, place, and time. She appears well-developed.  HENT:  Head: Normocephalic.  Eyes: Conjunctivae and EOM are normal. No scleral icterus.  Neck: Neck supple. No thyromegaly present.  Cardiovascular: Normal rate and regular rhythm.  Exam reveals no gallop and no friction rub.   No murmur heard. Pulmonary/Chest: No stridor. She has no wheezes. She has no rales. She exhibits no tenderness.  Abdominal: She exhibits no distension. There is no tenderness. There is no rebound.  Musculoskeletal: Normal range of motion. She exhibits no edema.  Pt is moderate ataxia  Lymphadenopathy:    She has no cervical adenopathy.  Neurological: She is oriented to person, place, and time. She exhibits normal muscle tone. Coordination normal.  Skin: No rash noted. No erythema.  Psychiatric: She has a normal mood and affect. Her behavior is normal.    ED Course  Procedures (including critical care time) Labs Review Labs Reviewed  CBC WITH DIFFERENTIAL/PLATELET - Abnormal; Notable for the following:  RDW 17.1 (*)    All other components within normal limits  COMPREHENSIVE METABOLIC PANEL - Abnormal; Notable for the following:    Potassium 3.2 (*)    Glucose, Bld 110 (*)    Albumin 3.4 (*)    AST 223 (*)    ALT 520 (*)    Alkaline Phosphatase 223 (*)    Total Bilirubin 1.8 (*)    GFR calc non Af Amer 81 (*)    All other  components within normal limits  LIPASE, BLOOD    Imaging Review Mr Lumbar Spine Wo Contrast  11/26/2014   CLINICAL DATA:  Bilateral leg weakness.  Weakness for 3 months.  EXAM: MRI LUMBAR SPINE WITHOUT CONTRAST  TECHNIQUE: Multiplanar, multisequence MR imaging of the lumbar spine was performed. No intravenous contrast was administered.  COMPARISON:  None.  FINDINGS: The vertebral bodies of the lumbar spine are normal in size. The vertebral bodies of the lumbar spine are normal in alignment. There is normal bone marrow signal demonstrated throughout the vertebra. The intervertebral disc spaces are well-maintained.  The spinal cord is normal in signal and contour. The cord terminates normally at L1 . The nerve roots of the cauda equina and the filum terminale are normal.  The visualized portions of the SI joints are unremarkable.  10 mm T2 hyperintense and T1 hypointense right renal mass statistically likely represent a cyst.  T12-L1: No significant disc bulge. No evidence of neural foraminal stenosis. No central canal stenosis.  L1-L2: No significant disc bulge. No evidence of neural foraminal stenosis. No central canal stenosis.  L2-L3: No significant disc bulge. No evidence of neural foraminal stenosis. No central canal stenosis.  L3-L4: No significant disc bulge. No evidence of neural foraminal stenosis. No central canal stenosis.  L4-L5: No significant disc bulge. No evidence of neural foraminal stenosis. No central canal stenosis.  L5-S1: No significant disc bulge. No evidence of neural foraminal stenosis. No central canal stenosis. Mild bilateral facet arthropathy.  IMPRESSION: 1. No significant lumbar spine disc protrusion, foraminal stenosis or central canal stenosis. 2. Mild bilateral facet arthropathy at L5-S1.   Electronically Signed   By: Elige KoHetal  Patel   On: 11/26/2014 16:02   Dg Abd Acute W/chest  11/26/2014   CLINICAL DATA:  Generalized weakness.  EXAM: ACUTE ABDOMEN SERIES (ABDOMEN 2 VIEW &  CHEST 1 VIEW)  COMPARISON:  None.  FINDINGS: There is no evidence of dilated bowel loops or free intraperitoneal air. No radiopaque calculi or other significant radiographic abnormality is seen. Heart size and mediastinal contours are within normal limits. Both lungs are clear.  IMPRESSION: Negative abdominal radiographs.  No acute cardiopulmonary disease.   Electronically Signed   By: Francene BoyersJames  Maxwell M.D.   On: 11/26/2014 11:35     EKG Interpretation None     I spoke with her gi md Dr. Jason FilaBray and he suggested to get a hyda scan with cck for elevated liver studies MDM   Final diagnoses:  Weak  Pain  Leg weakness  Ataxia    Admit for ataxia,  Gi consuleded   Bethann BerkshireJoseph Avon Mergenthaler, MD 11/26/14 1712

## 2014-11-26 NOTE — H&P (Addendum)
[facility] History and Physical  [name] [name] ZOX:[id number] DOB: 03-Apr-1976 DOA: 11/26/2014  Referring physician: Emergency Department PCP: No primary care provider on file.  Specialists:   Chief Complaint: Nausea, elevated LFT  HPI: [name] [name] is a 39 y.o. female  With a hx of morbid obesity s/p gastric bypass in 12/15, HTN, who presents with progressive nausea and weakness. In the ED, pt noted to have difficulty ambulating. She was noted to have increased LFT's: alk phos of 223, alt 520, and ast 223 with bili of 1.8. GI was consulted through the ED and hospitalist consulted for admission.  Review of Systems:  Review of Systems  Constitutional: Positive for weight loss and malaise/fatigue. Negative for fever and chills.  HENT: Negative for ear pain.   Eyes: Negative for pain and discharge.  Respiratory: Negative for hemoptysis and wheezing.   Cardiovascular: Negative for chest pain and palpitations.  Gastrointestinal: Positive for nausea and constipation. Negative for abdominal pain.  Genitourinary: Negative for frequency and flank pain.  Musculoskeletal: Negative for myalgias and joint pain.  Skin: Negative for itching.  Neurological: Positive for weakness. Negative for tremors, seizures, loss of consciousness and headaches.  Psychiatric/Behavioral: Negative for memory loss. The patient does not have insomnia.      Past Medical History  Diagnosis Date  . Hypertension    Past Surgical History  Procedure Laterality Date  . Laparoscopic gastric sleeve resection     Social History:  reports that she has never smoked. She does not have any smokeless tobacco history on file. She reports that she does not drink alcohol or use illicit drugs.  where does patient live--home, ALF, SNF? and with whom if at home?  Can patient participate in ADLs?  Allergies  Allergen Reactions  . Levaquin [Levofloxacin In D5w] Rash  . Penicillins Itching and Rash    Rash on lips/itch     History reviewed. No pertinent family history. multiple family members with HTN (be sure to complete)  Prior to Admission medications   Medication Sig Start Date End Date Taking? Authorizing Provider  losartan-hydrochlorothiazide (HYZAAR) 50-12.5 MG per tablet Take 1 tablet by mouth daily. 11/01/14  Yes Historical Provider, MD  ondansetron (ZOFRAN) 8 MG tablet Take 1 tablet (8 mg total) by mouth every 8 (eight) hours as needed for nausea. Patient not taking: Reported on 11/26/2014 11/23/14   Orlie Dakin, MD  potassium chloride SA (K-DUR,KLOR-CON) 20 MEQ tablet Take 1 tablet (20 mEq total) by mouth 2 (two) times daily. Patient not taking: Reported on 11/26/2014 11/23/14   Orlie Dakin, MD   Physical Exam: Filed Vitals:   11/26/14 1430 11/26/14 1500 11/26/14 1515 11/26/14 1610  BP: 155/118 156/111 155/106 137/101  Pulse: 90 90 88 91  Temp:      TempSrc:      Resp: 21 15 16 16   SpO2: 100% 99% 100% 100%     General:  Awake, in nad  Eyes: PERRL B  ENT: membranes moist, dentition fair  Neck: trachea midline, neck supple  Cardiovascular: regular, s1, s2  Respiratory: normal resp effort, no wheezing  Abdomen: soft,nondistended  Skin: normal skin turgor, no abnormal skin lesions seen  Musculoskeletal: perfused, no clubbing  Psychiatric: flat affect, affect normal  Neurologic: cn2-12 grossly intact, strength/sensation intact  Labs on Admission:  Basic Metabolic Panel:  Recent Labs Lab 11/23/14 1701 11/23/14 1814 11/26/14 1100  NA 147* 149* 145  K 2.8* 3.2* 3.2*  CL 108 107 107  CO2 28  --  32  GLUCOSE 114* 106* 110*  BUN 24* 29* 17  CREATININE 0.76 0.80 0.88  CALCIUM 9.9  --  9.8   Liver Function Tests:  Recent Labs Lab 11/26/14 1100  AST 223*  ALT 520*  ALKPHOS 223*  BILITOT 1.8*  PROT 7.5  ALBUMIN 3.4*    Recent Labs Lab 11/23/14 1809 11/26/14 1100  LIPASE 55 48   No results for input(s): AMMONIA in the last 168 hours. CBC:  Recent  Labs Lab 11/23/14 1701 11/23/14 1814 11/26/14 1100  WBC 9.5  --  6.9  NEUTROABS 5.2  --  4.2  HGB 13.5 15.0 13.1  HCT 39.8 44.0 38.6  MCV 82.1  --  81.6  PLT 294  --  274   Cardiac Enzymes: No results for input(s): CKTOTAL, CKMB, CKMBINDEX, TROPONINI in the last 168 hours.  BNP (last 3 results) No results for input(s): BNP in the last 8760 hours.  ProBNP (last 3 results) No results for input(s): PROBNP in the last 8760 hours.  CBG: No results for input(s): GLUCAP in the last 168 hours.  Radiological Exams on Admission: Mr Lumbar Spine Wo Contrast  11/26/2014   CLINICAL DATA:  Bilateral leg weakness.  Weakness for 3 months.  EXAM: MRI LUMBAR SPINE WITHOUT CONTRAST  TECHNIQUE: Multiplanar, multisequence MR imaging of the lumbar spine was performed. No intravenous contrast was administered.  COMPARISON:  None.  FINDINGS: The vertebral bodies of the lumbar spine are normal in size. The vertebral bodies of the lumbar spine are normal in alignment. There is normal bone marrow signal demonstrated throughout the vertebra. The intervertebral disc spaces are well-maintained.  The spinal cord is normal in signal and contour. The cord terminates normally at L1 . The nerve roots of the cauda equina and the filum terminale are normal.  The visualized portions of the SI joints are unremarkable.  10 mm T2 hyperintense and T1 hypointense right renal mass statistically likely represent a cyst.  T12-L1: No significant disc bulge. No evidence of neural foraminal stenosis. No central canal stenosis.  L1-L2: No significant disc bulge. No evidence of neural foraminal stenosis. No central canal stenosis.  L2-L3: No significant disc bulge. No evidence of neural foraminal stenosis. No central canal stenosis.  L3-L4: No significant disc bulge. No evidence of neural foraminal stenosis. No central canal stenosis.  L4-L5: No significant disc bulge. No evidence of neural foraminal stenosis. No central canal stenosis.   L5-S1: No significant disc bulge. No evidence of neural foraminal stenosis. No central canal stenosis. Mild bilateral facet arthropathy.  IMPRESSION: 1. No significant lumbar spine disc protrusion, foraminal stenosis or central canal stenosis. 2. Mild bilateral facet arthropathy at L5-S1.   Electronically Signed   By: Kathreen Devoid   On: 11/26/2014 16:02   Dg Abd Acute W/chest  11/26/2014   CLINICAL DATA:  Generalized weakness.  EXAM: ACUTE ABDOMEN SERIES (ABDOMEN 2 VIEW & CHEST 1 VIEW)  COMPARISON:  None.  FINDINGS: There is no evidence of dilated bowel loops or free intraperitoneal air. No radiopaque calculi or other significant radiographic abnormality is seen. Heart size and mediastinal contours are within normal limits. Both lungs are clear.  IMPRESSION: Negative abdominal radiographs.  No acute cardiopulmonary disease.   Electronically Signed   By: Lorriane Shire M.D.   On: 11/26/2014 11:35    Assessment/Plan Principal Problem:   Elevated LFTs Active Problems:   Ataxia   Obesity   Anxiety  1. Elevated LFT's 1. Will order HIDA 2. GI consulted through ED  3. Pt does have GB. Discussed with ED staff who discussed with pt's primary GI. Through ED, pt's recent abd [place] and abd CT were unremarkable. 4. Admit to med-surg 2. Weakness/ataxia 1. Will consult PT/OT 2. MRI lumbar spine unremarkable 3. HTN 1. Will continue home meds 4. Obesity 1. S/p gastric bypass 5. Anxiety 1. stable 6. DVT prophylaxis 1. Heparin subQ   Code Status: Full (must indicate code status--if unknown or must be presumed, indicate so) Family Communication: Pt in room (indicate person spoken with, if applicable, with phone number if by telephone) Disposition Plan: [facility] (indicate anticipated LOS)   Citlalic Norlander, [facility] Pager [phone]  If 7PM-7AM, please contact night-coverage [email or web address] Password [email or web address] 11/26/2014, 5:03 PM

## 2014-11-26 NOTE — ED Notes (Addendum)
Per EMS - pt from home. Pt had gastric bypass in January, has lost 100lbs since then. Pt has had generalized weakness x3 months. Pt non-ambulatory majority of the time d/t weakness. PCP states pt is ok. [facility] but wants to see if she could possibly get a referral to another gastroenterologist sooner than her 2nd opinion appointment scheduled for 4/15. BP 162/118, pulse 80, 16 RR, cbg 123, 98% on RA.

## 2014-11-26 NOTE — Progress Notes (Signed)
LCSW was alerted by RN regarding family request to speak to SW about placement for patient. Met with patient, patient husband and patient father. Patient had bariatric surgery in November 2015.  Since surgery patient has decompensated physically as she has lost 100lbs but physically deconditioned, unable to hold liquids and food down and become bed bound.  Husband reports multiple hospital visits with no conclusion as to why patient has responded this way have surgery, especially this long. The biggest concern from husband is how patient can no longer walk.  Reports she has to picked up to go to the bathroom, assistance with all ADLs.    Patient prior to surgery was independent and working.  Currently on FMLA and short term disability.  Works at [employer].  Patient and husband have a 24 year old child who is in the home and strong family/community support.  LCSW gave patient emotional support and discussed using CBT feelings after surgery to rule out depression/anxiety.  Patient reports life is just not what she thought it was going to be after the surgery. Reports feeling overwhelmed, anxious about how much she has to eat, measuring food, and loss of interest with eating as food does not taste the same and cannot eat as much.  LCSW assessed prior food intake and patient reports she loved food and found a lot of comfort in food.  All social gatherings involved food and thus has lost pleasure associated with eating.    Patient would benefit from counseling regarding the transition after her baratric surgery.  LCSW recommended [facility] as she is a patient at that bariatric clinic and LCSW will see if she can schedule/make referral.  Patient agreeable. Patient would also benefit from home health/outpatient physical therapy in effort to motivate her to begin walking, and completing basic tasks.  Family agreeable. Family educated that she does not qualify for a nursing home at this time.  They are  understanding, but husband still struggling to understand the mental aspect of patient's perception of food and how this affects her daily life.  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

## 2014-11-26 NOTE — ED Notes (Signed)
Social work at bedside.  

## 2014-11-27 ENCOUNTER — Inpatient Hospital Stay (HOSPITAL_COMMUNITY): Payer: BLUE CROSS/BLUE SHIELD

## 2014-11-27 ENCOUNTER — Encounter (HOSPITAL_COMMUNITY): Payer: Self-pay | Admitting: Physician Assistant

## 2014-11-27 DIAGNOSIS — F05 Delirium due to known physiological condition: Secondary | ICD-10-CM

## 2014-11-27 DIAGNOSIS — R627 Adult failure to thrive: Secondary | ICD-10-CM

## 2014-11-27 LAB — CBC
HEMATOCRIT: 32.8 % — AB (ref 36.0–46.0)
Hemoglobin: 11.3 g/dL — ABNORMAL LOW (ref 12.0–15.0)
MCH: 28 pg (ref 26.0–34.0)
MCHC: 34.5 g/dL (ref 30.0–36.0)
MCV: 81.4 fL (ref 78.0–100.0)
Platelets: 271 10*3/uL (ref 150–400)
RBC: 4.03 MIL/uL (ref 3.87–5.11)
RDW: 17.5 % — ABNORMAL HIGH (ref 11.5–15.5)
WBC: 7.5 10*3/uL (ref 4.0–10.5)

## 2014-11-27 LAB — COMPREHENSIVE METABOLIC PANEL
ALT: 349 U/L — ABNORMAL HIGH (ref 0–35)
AST: 112 U/L — AB (ref 0–37)
Albumin: 2.9 g/dL — ABNORMAL LOW (ref 3.5–5.2)
Alkaline Phosphatase: 185 U/L — ABNORMAL HIGH (ref 39–117)
Anion gap: 9 (ref 5–15)
BUN: 8 mg/dL (ref 6–23)
CALCIUM: 8.8 mg/dL (ref 8.4–10.5)
CO2: 27 mmol/L (ref 19–32)
CREATININE: 0.77 mg/dL (ref 0.50–1.10)
Chloride: 108 mmol/L (ref 96–112)
GFR calc Af Amer: >90 mL/min (ref >90)
Glucose, Bld: 97 mg/dL (ref 70–99)
Potassium: 2.9 mmol/L — ABNORMAL LOW (ref 3.5–5.1)
Sodium: 144 mmol/L (ref 135–145)
Total Bilirubin: 1.2 mg/dL (ref 0.3–1.2)
Total Protein: 6.3 g/dL (ref 6.0–8.3)

## 2014-11-27 LAB — TSH: TSH: 1.231 u[IU]/mL (ref 0.350–4.500)

## 2014-11-27 LAB — AMMONIA: Ammonia: 31 umol/L (ref 11–32)

## 2014-11-27 MED ORDER — LACTULOSE 10 GM/15ML PO SOLN
20.0000 g | Freq: Three times a day (TID) | ORAL | Status: DC
  Administered 2014-11-27 – 2014-12-08 (×29): 20 g via ORAL
  Filled 2014-11-27 (×36): qty 30

## 2014-11-27 MED ORDER — STERILE WATER FOR INJECTION IJ SOLN
INTRAMUSCULAR | Status: AC
  Filled 2014-11-27: qty 10

## 2014-11-27 MED ORDER — POTASSIUM CHLORIDE 10 MEQ/100ML IV SOLN
10.0000 meq | INTRAVENOUS | Status: AC
  Administered 2014-11-27 (×2): 10 meq via INTRAVENOUS
  Filled 2014-11-27 (×4): qty 100

## 2014-11-27 MED ORDER — PANTOPRAZOLE SODIUM 40 MG IV SOLR
40.0000 mg | INTRAVENOUS | Status: DC
  Filled 2014-11-27: qty 40

## 2014-11-27 MED ORDER — POTASSIUM CHLORIDE 10 MEQ/100ML IV SOLN
10.0000 meq | INTRAVENOUS | Status: AC
  Administered 2014-11-27 (×2): 10 meq via INTRAVENOUS
  Filled 2014-11-27 (×2): qty 100

## 2014-11-27 MED ORDER — TECHNETIUM TC 99M MEBROFENIN IV KIT
5.0000 | PACK | Freq: Once | INTRAVENOUS | Status: AC | PRN
  Administered 2014-11-27: 5 via INTRAVENOUS

## 2014-11-27 MED ORDER — PANTOPRAZOLE SODIUM 40 MG PO TBEC
40.0000 mg | DELAYED_RELEASE_TABLET | Freq: Every day | ORAL | Status: DC
  Administered 2014-11-27: 40 mg via ORAL
  Filled 2014-11-27: qty 1

## 2014-11-27 MED ORDER — ENSURE ENLIVE PO LIQD
237.0000 mL | Freq: Two times a day (BID) | ORAL | Status: DC
  Administered 2014-11-28 (×2): 237 mL via ORAL

## 2014-11-27 MED ORDER — SINCALIDE 5 MCG IJ SOLR
0.0200 ug/kg | Freq: Once | INTRAMUSCULAR | Status: AC
  Administered 2014-11-27: 1.5 ug via INTRAVENOUS
  Filled 2014-11-27: qty 5

## 2014-11-27 MED ORDER — SINCALIDE 5 MCG IJ SOLR
INTRAMUSCULAR | Status: AC
  Administered 2014-11-27: 1.5 ug via INTRAVENOUS
  Filled 2014-11-27: qty 5

## 2014-11-27 MED ORDER — POTASSIUM CHLORIDE IN NACL 20-0.9 MEQ/L-% IV SOLN
INTRAVENOUS | Status: DC
  Administered 2014-11-27: 1000 mL via INTRAVENOUS
  Administered 2014-11-28 (×2): via INTRAVENOUS
  Administered 2014-11-29 – 2014-11-30 (×2): 1000 mL via INTRAVENOUS
  Administered 2014-11-30: 75 mL/h via INTRAVENOUS
  Administered 2014-12-01 – 2014-12-05 (×7): via INTRAVENOUS
  Filled 2014-11-27 (×19): qty 1000

## 2014-11-27 NOTE — Progress Notes (Signed)
Utilization review completed. Brysen Shankman, RN, BSN. 

## 2014-11-27 NOTE — Consult Note (Signed)
[facility] Consult: 10:38 AM 11/27/2014  LOS: 1 day    Referring Provider: Dr Erlinda Hong  Primary Care Physician: [facility]. Dr Billey Chang.    Primary Gastroenterologist:  Dr. Tora Duck Surgeon:  Dr  Heron Nay.    Reason for Consultation:  Abnormal LFTs.    HPI: [name] [name] is a 39 y.o. female.  Morbidly obese pt s/p sleeve gastrectomy in 06/2014.   She is not a good historian.  Pieced together hx from notes in [place].  Issues with post op n/v leading to several radiologic studies outlined below.  Serum H Pylori test + on surgery pathology so this was treated.  S/p EGD (date not clear) showing "gastritis, erosion at the bypass surgery site".  LFTs mildly elevated. Several solid, indeterminate, liver lesions seen on CTs in 09/2014 and 10/2014.  Admitted to [facility] 2/23-2/27 with hypernatremia, hypokalemia, hypercalcemia, metabollic acidosis, ARF, mild elevation transaminases.  AFP and viral hepatitis studies were normal. Nausea described as hard to control, constipation treated with enemas.  Required TPN. At discharge was started on Doxepin, Reglan, prn Zofran, prn Miralax, prn Senokot.  No clear cause for n/v found after multiple studies.  Omeprazole, Carafate continued. Started on course of Diflucan and Nystatin for oral thrush 3/15.  Referred to a psychologist after surgical follow up on 3/16.   Seen 3/26 at [facility] ED for weakness anorexia "not eating", dizziness, making it difficult to walk.  No abdominal pain, no dyspnea.  Treated for dehydration.  Came to [facility] ED 3/30 with same sxs and admitted. ED MD noted ataxia.  Labs with abnormal LFTs.  These are improved today. Potassium 3.2, today 2.9.  BUN/creat normal.  WBCs normal. AAS xrays normal.   LFTs reviewed: 2/25  AST/ALT: 43/74. alk phos 88.  T bili 0.5 2/26 AST/ALT 2/26: 107/125. Alk phos 90.  t bili 0.7.  GGT 226.  2/27 AST/ALT 2/27: 204/240.  Alk phos 101.  t bili 1.08 3/26 AST/ALT 3/26: 29/92.  Alk phos 97.  t bili0.93 3/30 AST/ALT: 223/520.  Alk phos 223.  t bili 1.8. 3/31 AST/ALT: 112/349.  Alk phos 185.  t bili 1.2.    Ultrasound and HIDA scan ordered.   At present pt confused (one example:gave 2015 as year after long period of pondering answer).  She can not recall much of her hx.  She denies abdominal pain, says overall nausea better, just suffers queasiness and is not vomiting.  Says she eats "2 regular meals" per day, no snacking.  Still constipated, about 2 small BMs per week but not using available prn laxatives.   Her complaint is weakness only.  Denies falls. 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  Has no idea how much weight she's lost and difficulty recalling her starting weight. Her records show weight of 244# 06/24/14, current weight is 163#   Previous imaging and relevant GI studies at [facility] EGD, timing not clear as above  3 way abdominal films 11/22/14  For vomiting FINDINGS: Distended bowel loops in the left upper quadrant  of the abdomen likely colon. No other distended bowel loops seen. No pneumoperitoneum. No abdominal masses or calcifications. The heart is normal in size. The lungs are clear. No pleural effusions or pneumothorax. No acute bony abnormality  Brain CT 11/22/14 For altered mental status.  Negative.   CT abd/Pelvis with contrast 10/22/14  INDICATION: Vomiting and constipation COMPARISON: 10/14/2014 FINDINGS: The lung bases are clear. No pleural or pericardial effusions. Redemonstration of several solid liver lesions. No calcified gallstones or CT evidence of cholecystitis. The spleen, pancreas, and adrenal glands are unremarkable. Kidneys are normal except for a small simple right renal cyst. No ureteral stones or hydronephrosis. Gastric  surgery. Urinary bladder is unremarkable. Uterus and adnexa within normal limits. No diverticulitis or colitis. Normal appendix. No bowel obstruction. No ascites or pneumoperitoneum. No adenopathy. No acute fractures or bone destruction.  Gastric emptying study 10/16/2014: INDICATION: K30: Functional dyspepsia FINDINGS: There was initial prompt gastric emptying over 60 minutes. This then plateaus for the next 180 minutes. Gastric emptying at one hour is 73%.  Gastric emptying at 4 hours is 81%.  CT ABD WO W/CONT 10/14/2014 IMPRESSION: Postsurgical changes status post partial gastrectomy. Several liver lesions identified. These lesions appear to enhance  excluding cysts. The imaging characteristics are not definitive for a  particular type of lesion with benign and malignant etiologies remain in  the differential. This could represent lesion such as an etiology such as  small adenomas, atypical hemangiomas, or focal nodular hyperplasia. If  history of malignancy metastasis not entirely excluded. There prior  outside images are available these be helpful to assess for interval  change and stability would be most consistent with benign etiologies.  UGI series 09/24/2014 INDICATION: NAUSEA AND VOMITING SINCE VERTICAL SLEEVE GASTRECTOMY IN NOVEMBER 2015 FINDINGS: Deformity of the stomach consistent with vertical sleeve  gastrectomy. Gastroesophageal junction patent. Duodenal sweep normal.  Esophagus normal. Fluoroscopy time: 138 seconds  IMPRESSION: Partial gastrectomy without evidence of acute complications.     Past Medical History  Diagnosis Date  . Hypertension   . ARF (acute renal failure) 09/2014    admitted with electrolyte disturbance, ARF to [facility].   [facility] Positive H. pylori test 06/2014    serum H pylori + and treated.     Past Surgical History  Procedure Laterality Date  . 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  Endometrial ablation w/ novasure    . Tubal ligation  2002  . Esophagogastroduodenoscopy  2016    Dr Tora Duck of [facility].  found gastritis, erosion at bypass surgery site.     Prior to Admission medications   Medication Sig Start Date End Date Taking? Authorizing Provider  losartan-hydrochlorothiazide (HYZAAR) 50-12.5 MG per tablet Take 1 tablet by mouth daily. 11/01/14  Yes Historical Provider, MD  ondansetron (ZOFRAN) 8 MG tablet Take 1 tablet (8 mg total) by mouth every 8 (eight) hours as needed for nausea. Patient not taking: Reported on 11/26/2014 11/23/14   Orlie Dakin, MD  potassium chloride SA (K-DUR,KLOR-CON) 20 MEQ tablet Take 1 tablet (20 mEq total) by mouth 2 (two) times daily. Patient not taking: Reported on 11/26/2014 11/23/14   Orlie Dakin, MD    Scheduled Meds: . heparin  5,000 Units Subcutaneous 3 times per day  . losartan  50 mg Oral Daily   And  . hydrochlorothiazide  12.5 mg Oral Daily   Infusions: . sodium chloride 100 mL/hr at 11/27/14 0400   PRN  Meds: morphine injection, ondansetron **OR** ondansetron (ZOFRAN) IV   Allergies as of 11/26/2014 - Review Complete 11/26/2014  Allergen Reaction Noted  . Levaquin [levofloxacin in d5w] Rash 11/23/2014  . Penicillins Itching and Rash 11/23/2014    History reviewed. No pertinent family history.  History   Social History  . Marital Status: Married    Spouse Name: N/A  . Number of Children: N/A  . Years of Education: N/A   Occupational History  . Not on file.   Social History Main Topics  . Smoking status: Never Smoker   . Smokeless tobacco: Not on file  . Alcohol Use: No  . Drug Use: No  . Sexual Activity: Not on file   Other Topics Concern  . Not on file   Social History Narrative    REVIEW OF SYSTEMS: Constitutional:  weakness ENT:  No nose bleeds Pulm:  No SOB or cough CV:  No palpitations, no LE edema. No chest pain GU:  No hematuria, no frequency GI:  No dysphagia, no  blood in emesis or stool Heme:  No unusual bleeding or bruising   Transfusions:  non Neuro:  No headaches, no peripheral tingling or numbness Derm:  No itching, no rash or sores.  Endocrine:  No sweats or chills.  No polyuria or dysuria Immunization:  Not queried.  Travel:  None beyond local counties in last few months.    PHYSICAL EXAM: Vital signs in last 24 hours: Filed Vitals:   11/27/14 0919  BP: 128/98  Pulse: 88  Temp: 98.4  Resp: 18   Wt Readings from Last 3 Encounters:  11/26/14 163 lb 9.6 oz (74.208 kg)    General: healthy appearing, alert but significant psycomotor/speach retardation.   Head:  No signs of trauma  Eyes:  No icterus or conj pallor Ears:  Not HOH  Nose:  No congestion or discharge Mouth:  Clear bil  Neck:  No mass or JVD Lungs:  Clear bil  Heart: RRR.  No mrg Abdomen:  Soft, healed laparoscopy scars, no mass, no HSM.  BS hypoactive.   Rectal: deferred   Musc/Skeltl: no joint swelling or deformity Extremities:  No CCE  Neurologic:  Oriented to self, hospital but not to year/date or details of her history.  Not able to elicit asterixis.  Limbs without weakness.  Skin:  No rash or telangectasia.  Tattoos:  none Nodes:  No cervical adenopathy   Psych:  Cooperative, relaxed.   Intake/Output from previous day: 03/30 0701 - 03/31 0700 In: 60 [P.O.:60] Out: 1350 [Urine:1350] Intake/Output this shift: Total I/O In: 0  Out: 500 [Urine:500]  LAB RESULTS:  Recent Labs  11/26/14 1100 11/27/14 0656  WBC 6.9 7.5  HGB 13.1 11.3*  HCT 38.6 32.8*  PLT 274 271   BMET Lab Results  Component Value Date   NA 144 11/27/2014   NA 145 11/26/2014   NA 149* 11/23/2014   K 2.9* 11/27/2014   K 3.2* 11/26/2014   K 3.2* 11/23/2014   CL 108 11/27/2014   CL 107 11/26/2014   CL 107 11/23/2014   CO2 27 11/27/2014   CO2 32 11/26/2014   CO2 28 11/23/2014   GLUCOSE 97 11/27/2014   GLUCOSE 110* 11/26/2014   GLUCOSE 106* 11/23/2014   BUN 8 11/27/2014     BUN 17 11/26/2014   BUN 29* 11/23/2014   CREATININE 0.77 11/27/2014   CREATININE 0.88 11/26/2014   CREATININE 0.80 11/23/2014   CALCIUM 8.8 11/27/2014   CALCIUM 9.8  11/26/2014   CALCIUM 9.9 11/23/2014   LFT  Recent Labs  11/26/14 1100 11/27/14 0656  PROT 7.5 6.3  ALBUMIN 3.4* 2.9*  AST 223* 112*  ALT 520* 349*  ALKPHOS 223* 185*  BILITOT 1.8* 1.2   PT/INR Lab Results  Component Value Date   INR 1.13 11/23/2014   Lipase     Component Value Date/Time   LIPASE 48 11/26/2014 1100    Drugs of Abuse     Component Value Date/Time   LABOPIA NONE DETECTED 11/23/2014 2143   COCAINSCRNUR NONE DETECTED 11/23/2014 2143   LABBENZ NONE DETECTED 11/23/2014 2143   AMPHETMU NONE DETECTED 11/23/2014 2143   THCU NONE DETECTED 11/23/2014 2143   LABBARB NONE DETECTED 11/23/2014 2143     RADIOLOGY STUDIES: Mr Lumbar Spine Wo Contrast  11/26/2014   CLINICAL DATA:  Bilateral leg weakness.  Weakness for 3 months.  EXAM: MRI LUMBAR SPINE WITHOUT CONTRAST  TECHNIQUE: Multiplanar, multisequence MR imaging of the lumbar spine was performed. No intravenous contrast was administered.  COMPARISON:  None.  FINDINGS: The vertebral bodies of the lumbar spine are normal in size. The vertebral bodies of the lumbar spine are normal in alignment. There is normal bone marrow signal demonstrated throughout the vertebra. The intervertebral disc spaces are well-maintained.  The spinal cord is normal in signal and contour. The cord terminates normally at L1 . The nerve roots of the cauda equina and the filum terminale are normal.  The visualized portions of the SI joints are unremarkable.  10 mm T2 hyperintense and T1 hypointense right renal mass statistically likely represent a cyst.  T12-L1: No significant disc bulge. No evidence of neural foraminal stenosis. No central canal stenosis.  L1-L2: No significant disc bulge. No evidence of neural foraminal stenosis. No central canal stenosis.  L2-L3: No significant  disc bulge. No evidence of neural foraminal stenosis. No central canal stenosis.  L3-L4: No significant disc bulge. No evidence of neural foraminal stenosis. No central canal stenosis.  L4-L5: No significant disc bulge. No evidence of neural foraminal stenosis. No central canal stenosis.  L5-S1: No significant disc bulge. No evidence of neural foraminal stenosis. No central canal stenosis. Mild bilateral facet arthropathy.  IMPRESSION: 1. No significant lumbar spine disc protrusion, foraminal stenosis or central canal stenosis. 2. Mild bilateral facet arthropathy at L5-S1.   Electronically Signed   By: Kathreen Devoid   On: 11/26/2014 16:02   Dg Abd Acute W/chest  11/26/2014   CLINICAL DATA:  Generalized weakness.  EXAM: ACUTE ABDOMEN SERIES (ABDOMEN 2 VIEW & CHEST 1 VIEW)  COMPARISON:  None.  FINDINGS: There is no evidence of dilated bowel loops or free intraperitoneal air. No radiopaque calculi or other significant radiographic abnormality is seen. Heart size and mediastinal contours are within normal limits. Both lungs are clear.  IMPRESSION: Negative abdominal radiographs.  No acute cardiopulmonary disease.   Electronically Signed   By: Lorriane Shire M.D.   On: 11/26/2014 11:35    ENDOSCOPIC STUDIES: EGD as above   IMPRESSION:   * Abnormal LFTs. No abdominal pain but need to rule out gallstones, biliary pathology. ? Drug reaction (DILI) Stable lesions of liver of unclear diagnosis, seen on CT in Feb and March 2016.  *  Hx morbid obesity.  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 Weight drop of 80 #.  Issues with post op N/V which currently pt claims have improved, however not convinced of this improvement.  Wonder if N/V is due to biliary issues?   *  Weakness  *  Encephalopathy. Concerned about hepatic encephalopathy.   *  Constipation.    PLAN:     *  Ammonia level when she returns from xray.   *  Await findings of ultrasound and HIDA  *  Will add Lactulose for the constipation (and for ?  Hepatic encepholopathy)   *  PPI.    Azucena Freed  11/27/2014, 10:38 AM Pager: [phone]  Addendum 2 PM: Ultrasound: Large volume of sludge within the gallbladder lumen possibly with tiny calculi associated. No gallbladder wall thickening or sonographic Murphy's sign. No biliary tree or liver abnormality. 3 mm CBD.   [facility] Attending  I have also seen and assessed the patient and agree with the advanced practitioner's assessment and plan. HIDA shows decreased GB EF - doubt that is cause of problems.   I am not sure why she has abnormal LFT's Vomiting problems began after sleeve gastrectomy.  Needs further serologic evaluation - has had negative hepatitis A B and C at [facility]. Will do autoimmune and inherited workup here. Her ferritin was not elevated either, globulin not elevated.  She is a bit slow re: mentattion - year 2015, thought she was at [facility] and took a while to answer Obama as president. No asterixis.

## 2014-11-27 NOTE — Evaluation (Signed)
Occupational Therapy Evaluation Patient Details Name: [name] FantasiaChristel Krzywicki MRN: [id number] DOB: 05/20/1976 Today's Date: 11/27/2014    History of Present Illness Pt is a 39 y.o. Female PMH of HTN, obesity, and s/p gastric bypass in 07/2014 admitted 11/26/14 for nausea, elevated LFT, weakness and ataxia.    Clinical Impression   PTA pt lived at home and was independent with ADLs, although she admits increasing difficulty at home due to weakness. Pt had questionable effort during OT session and no family present to determine accuracy of information. Feel that pt would be a fall risk at this time if she returned home and will need to progress to min guard level to return home with family support. Pt will benefit from acute OT for strengthening and ADLs.     Follow Up Recommendations  SNF;Supervision/Assistance - 24 hour    Equipment Recommendations  Other (comment) (TBD)    Recommendations for Other Services Other (comment) (Psych consult?)     Precautions / Restrictions Precautions Precautions: Fall Restrictions Weight Bearing Restrictions: No      Mobility Bed Mobility Overal bed mobility: Needs Assistance Bed Mobility: Supine to Sit;Sit to Supine     Supine to sit: Min guard Sit to supine: Min guard   General bed mobility comments: VC's for sequencing. Minimal effort but able to perform with increased time. No physical assist needed.   Transfers Overall transfer level: Needs assistance Equipment used: 1 person hand held assist Transfers: Sit to/from UGI CorporationStand;Stand Pivot Transfers Sit to Stand: Mod assist Stand pivot transfers: Mod assist       General transfer comment: Mod A to rise due to LE weakness and questionable effort.          ADL Overall ADL's : Needs assistance/impaired Eating/Feeding: NPO Eating/Feeding Details (indicate cue type and reason): NPO for US Grooming: Set up;Sitting   Upper Body Bathing: Set up;Sitting   Lower Body Bathing: Moderate  assistance;Sit to/from stand   Upper Body Dressing : Set up;Sitting   Lower Body Dressing: Maximal assistance;+2 for physical assistance;Sit to/from stand   Toilet Transfer: Moderate assistance;Stand-pivot (hand held assist) Toilet Transfer Details (indicate cue type and reason): bed>recliner>bed           General ADL Comments: Question pt's effort level. Pt able to stand with min A initially, then minimally attempted and stated "I can't stand again." Pt was able to stand x2 with mod A. Pt with flat affect but agreeable to participate in therapy.                Pertinent Vitals/Pain Pain Assessment: No/denies pain     Hand Dominance Right   Extremity/Trunk Assessment Upper Extremity Assessment Upper Extremity Assessment: Generalized weakness (tested 4+/5 throughout)   Lower Extremity Assessment Lower Extremity Assessment: Generalized weakness;Defer to PT evaluation   Cervical / Trunk Assessment Cervical / Trunk Assessment: Normal   Communication Communication Communication: No difficulties   Cognition Arousal/Alertness: Awake/alert Behavior During Therapy: Flat affect Overall Cognitive Status: Within Functional Limits for tasks assessed                                Home Living Family/patient expects to be discharged to:: Private residence Living Arrangements: Spouse/significant other;Children Available Help at Discharge: Family Type of Home: House Home Access: Stairs to enter Secretary/administratorntrance Stairs-Number of Steps: 1 Entrance Stairs-Rails: None Home Layout: One level     Bathroom Shower/Tub: Tub/shower unit Shower/tub characteristics: Engineer, building servicesCurtain Bathroom Toilet: Standard  Home Equipment: None          Prior Functioning/Environment Level of Independence: Independent        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    OT Diagnosis: Generalized weakness   OT Problem List: Decreased strength;Decreased activity tolerance;Impaired balance (sitting and/or  standing)   OT Treatment/Interventions: Self-care/ADL training;Therapeutic exercise;Energy conservation;DME and/or AE instruction;Therapeutic activities;Patient/family education;Balance training    OT Goals(Current goals can be found in the care plan section) Acute Rehab OT Goals Patient Stated Goal: to get stronger OT Goal Formulation: With patient Time For Goal Achievement: 12/11/14 Potential to Achieve Goals: Good ADL Goals Pt Will Perform Lower Body Bathing: with min guard assist;sit to/from stand Pt Will Perform Lower Body Dressing: with min guard assist;sit to/from stand Pt Will Transfer to Toilet: with min guard assist;ambulating Pt Will Perform Toileting - Clothing Manipulation and hygiene: with min guard assist;sit to/from stand  OT Frequency: Min 2X/week    End of Session Equipment Utilized During Treatment: Gait belt  Activity Tolerance: Patient limited by fatigue Patient left: in bed;with call bell/phone within reach;Other (comment) (with transport)   Time: 1035-1100 OT Time Calculation (min): 25 min Charges:  OT General Charges $OT Visit: 1 Procedure OT Evaluation $Initial OT Evaluation Tier I: 1 Procedure OT Treatments $Self Care/Home Management : 8-22 mins G-Codes:    Rae Lips 2014-12-10, 11:47 AM  Carney Living, OTR/L Occupational Therapist [phone] (pager)

## 2014-11-27 NOTE — Progress Notes (Signed)
PROGRESS NOTE  [name] [name] ZOX:[id number] DOB: 25-Nov-1975 DOA: 11/26/2014 PCP: No primary care provider on file.  HPI/Recap of past 24 hours:  Returned from HIDA scan, no active n/v, denies pain, wanting to eat. Husband at bedside.  Assessment/Plan: Principal Problem:   Elevated LFTs Active Problems:   Ataxia   Obesity   Anxiety   Cholelithiasis   Elevation of LFT: unclear etiology, hepatitis panel/ab us/HIDA scan pending. GI consulted.  Hypokalemia: replace k  Confusion: MRI brain no acute finding, ammonia level pending. Will check tsh/b12/folate level.  FTT/progressive weakness: mri brain/spine no acute findings. PT/OT/SNF  Obesity s/p gastric bypass. Reported 80pounds of weight loss since bypass procedure in 06/2014. Dietician consult.  Htn: continue cozaar, hold httz.  Code Status: full  Family Communication: patient/husband/father  Disposition Plan: snf   Consultants:  GI  Procedures:  HIDA/ab us/mri brain/spine  Antibiotics:  none   Objective: BP 128/98 mmHg  Pulse 88  Temp(Src) 98.4 F (36.9 C) (Oral)  Resp 18  Ht  (1.727 m)  Wt 74.208 kg (163 lb 9.6 oz)  BMI 24.88 kg/m2  SpO2 100%  LMP  (LMP Unknown)  Intake/Output Summary (Last 24 hours) at 11/27/14 1652 Last data filed at 11/27/14 1506  Gross per 24 hour  Intake     60 ml  Output   2100 ml  Net  -2040 ml   Filed Weights   11/26/14 1755  Weight: 74.208 kg (163 lb 9.6 oz)    Exam:   General:  NAD, weak  Cardiovascular: RRR  Respiratory: CTABL  Abdomen: Soft/ND/NT, positive BS  Musculoskeletal: No Edema  Neuro: not oriented to time/place. Poor short term memory. Generalized weakness, not able to life legs against gravity, No focal deficit.  Data Reviewed: Basic Metabolic Panel:  Recent Labs Lab 11/23/14 1701 11/23/14 1814 11/26/14 1100 11/27/14 0656  NA 147* 149* 145 144  K 2.8* 3.2* 3.2* 2.9*  CL 108 107 107 108  CO2 28  --  32 27  GLUCOSE 114* 106*  110* 97  BUN 24* 29* 17 8  CREATININE 0.76 0.80 0.88 0.77  CALCIUM 9.9  --  9.8 8.8   Liver Function Tests:  Recent Labs Lab 11/26/14 1100 11/27/14 0656  AST 223* 112*  ALT 520* 349*  ALKPHOS 223* 185*  BILITOT 1.8* 1.2  PROT 7.5 6.3  ALBUMIN 3.4* 2.9*    Recent Labs Lab 11/23/14 1809 11/26/14 1100  LIPASE 55 48    Recent Labs Lab 11/27/14 1500  AMMONIA 31   CBC:  Recent Labs Lab 11/23/14 1701 11/23/14 1814 11/26/14 1100 11/27/14 0656  WBC 9.5  --  6.9 7.5  NEUTROABS 5.2  --  4.2  --   HGB 13.5 15.0 13.1 11.3*  HCT 39.8 44.0 38.6 32.8*  MCV 82.1  --  81.6 81.4  PLT 294  --  274 271   Cardiac Enzymes:   No results for input(s): CKTOTAL, CKMB, CKMBINDEX, TROPONINI in the last 168 hours. BNP (last 3 results) No results for input(s): BNP in the last 8760 hours.  ProBNP (last 3 results) No results for input(s): PROBNP in the last 8760 hours.  CBG: No results for input(s): GLUCAP in the last 168 hours.  No results found for this or any previous visit (from the past 240 hour(s)).   Studies: Mr Lumbar Spine Wo Contrast  11/26/2014   CLINICAL DATA:  Bilateral leg weakness.  Weakness for 3 months.  EXAM: MRI LUMBAR SPINE WITHOUT CONTRAST  TECHNIQUE: Multiplanar, multisequence MR imaging of the lumbar spine was performed. No intravenous contrast was administered.  COMPARISON:  None.  FINDINGS: The vertebral bodies of the lumbar spine are normal in size. The vertebral bodies of the lumbar spine are normal in alignment. There is normal bone marrow signal demonstrated throughout the vertebra. The intervertebral disc spaces are well-maintained.  The spinal cord is normal in signal and contour. The cord terminates normally at L1 . The nerve roots of the cauda equina and the filum terminale are normal.  The visualized portions of the SI joints are unremarkable.  10 mm T2 hyperintense and T1 hypointense right renal mass statistically likely represent a cyst.  T12-L1: No  significant disc bulge. No evidence of neural foraminal stenosis. No central canal stenosis.  L1-L2: No significant disc bulge. No evidence of neural foraminal stenosis. No central canal stenosis.  L2-L3: No significant disc bulge. No evidence of neural foraminal stenosis. No central canal stenosis.  L3-L4: No significant disc bulge. No evidence of neural foraminal stenosis. No central canal stenosis.  L4-L5: No significant disc bulge. No evidence of neural foraminal stenosis. No central canal stenosis.  L5-S1: No significant disc bulge. No evidence of neural foraminal stenosis. No central canal stenosis. Mild bilateral facet arthropathy.  IMPRESSION: 1. No significant lumbar spine disc protrusion, foraminal stenosis or central canal stenosis. 2. Mild bilateral facet arthropathy at L5-S1.   Electronically Signed   By: Elige KoHetal  Patel   On: 11/26/2014 16:02   Dg Abd Acute W/chest  11/26/2014   CLINICAL DATA:  Generalized weakness.  EXAM: ACUTE ABDOMEN SERIES (ABDOMEN 2 VIEW & CHEST 1 VIEW)  COMPARISON:  None.  FINDINGS: There is no evidence of dilated bowel loops or free intraperitoneal air. No radiopaque calculi or other significant radiographic abnormality is seen. Heart size and mediastinal contours are within normal limits. Both lungs are clear.  IMPRESSION: Negative abdominal radiographs.  No acute cardiopulmonary disease.   Electronically Signed   By: Francene BoyersJames  Maxwell M.D.   On: 11/26/2014 11:35    Scheduled Meds: . [START ON 11/28/2014] feeding supplement (ENSURE ENLIVE)  237 mL Oral BID BM  . heparin  5,000 Units Subcutaneous 3 times per day  . lactulose  20 g Oral TID  . losartan  50 mg Oral Daily  . [START ON 11/28/2014] pantoprazole (PROTONIX) IV  40 mg Intravenous Q24H  . potassium chloride  10 mEq Intravenous Q1 Hr x 4  . sterile water (preservative free)        Continuous Infusions: . 0.9 % NaCl with KCl 20 mEq / L       Time spent: >7435mins  Camela Wich MD, PhD  [facility] Pager  [phone]. If 7PM-7AM, please contact night-coverage at [email or web address], password [email or web address] 11/27/2014, 4:52 PM  LOS: 1 day

## 2014-11-27 NOTE — Evaluation (Signed)
Physical Therapy Evaluation Patient Details Name: [name] [name] MRN: [id number] DOB: 10/25/1975 Today's Date: 11/27/2014   History of Present Illness  Pt is a 39 y.o. Female PMH of HTN, obesity, and s/p gastric bypass in 07/2014 admitted 11/26/14 for nausea, elevated LFT, weakness and ataxia.   Clinical Impression  Pt admitted with/for nausea, weakness ataxia.  Pt currently limited functionally due to the problems listed. ( See problems list.)   Pt will benefit from PT to maximize function and safety in order to get ready for next venue listed below.     Follow Up Recommendations SNF    Equipment Recommendations  Rolling walker with 5" wheels    Recommendations for Other Services       Precautions / Restrictions Precautions Precautions: Fall      Mobility  Bed Mobility Overal bed mobility: Needs Assistance Bed Mobility: Supine to Sit;Sit to Supine     Supine to sit: Min assist Sit to supine: Min assist   General bed mobility comments: LE assist needed, trunk moving effortfulm, but no assis5t needed  Transfers Overall transfer level: Needs assistance   Transfers: Sit to/from Stand Sit to Stand: Mod assist         General transfer comment: Mod A to rise due to LE weakness   Ambulation/Gait             General Gait Details: not tested  Stairs            Wheelchair Mobility    Modified Rankin (Stroke Patients Only)       Balance Overall balance assessment: Needs assistance Sitting-balance support: No upper extremity supported Sitting balance-Leahy Scale: Fair Sitting balance - Comments: accept minimal challenge, back especially weak   Standing balance support: Bilateral upper extremity supported Standing balance-Leahy Scale: Poor Standing balance comment: pt's knees bore weight without buckling.  Pt able to w/shift and take smaller amplitude steps in place.                             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; husband works 6:30 to 18:00+, daughter at school     Hand Dominance   Dominant Hand: Right    Extremity/Trunk Assessment               Lower Extremity Assessment: Generalized weakness;RLE deficits/detail;LLE deficits/detail RLE Deficits / Details: df/pf4+/5, hams 3-/5, quads 3-/5, hip flexors 2+ to 3-/5 LLE Deficits / Details: df/pf 4+/5, Hams 3-/5, quads 3/5, hip flexors 3-/5  Cervical / Trunk Assessment:  (generally weak, back weaker than abdominals)  Communication   Communication: No difficulties  Cognition Arousal/Alertness: Awake/alert Behavior During Therapy: WFL for tasks assessed/performed;Flat affect Overall Cognitive Status: Within Functional Limits for tasks assessed                      General Comments      Exercises        Assessment/Plan    PT Assessment Patient needs continued PT services  PT Diagnosis Generalized weakness;Difficulty walking  PT Problem List Decreased strength;Decreased activity tolerance;Decreased balance;Decreased mobility;Decreased coordination;Decreased knowledge of use of DME  PT Treatment Interventions DME instruction;Gait training;Functional mobility training;Therapeutic activities;Balance training;Patient/family education   PT Goals (Current goals can be found in the Care Plan section) Acute Rehab PT Goals Patient Stated Goal: to get stronger PT Goal Formulation: With patient Time For Goal Achievement: 12/11/14 Potential to Achieve Goals: Good    Frequency Min 3X/week    Barriers to discharge  (decreased support days when husband works)      Co-evaluation               End of Session   Activity Tolerance: Patient tolerated treatment well;Patient limited by fatigue Patient left: in bed;with call bell/phone within reach;with family/visitor present Nurse Communication: Mobility status         Time: 4098-1191 PT Time Calculation (min) (ACUTE ONLY): 25 min   Charges:   PT Evaluation $Initial PT Evaluation Tier I: 1 Procedure PT Treatments $Therapeutic Activity: 8-22 mins   PT G Codes:        [name] [name], Eliseo Gum 11/27/2014, 4:13 PM 11/27/2014  [place], PT [phone]  (pager)

## 2014-11-27 NOTE — Progress Notes (Signed)
Pt and sister-in-law stated pt has not had a BM x 2 wks. Appetite extremely poor, eats 2 spoons of yogurt w/sm amt fluids daily. RN edu pt and family at risk for constipation, pt and family appears reluctant to have MD notified. Will monitor for BM during hs.

## 2014-11-28 LAB — CBC
HEMATOCRIT: 33.3 % — AB (ref 36.0–46.0)
Hemoglobin: 11.4 g/dL — ABNORMAL LOW (ref 12.0–15.0)
MCH: 27.6 pg (ref 26.0–34.0)
MCHC: 34.2 g/dL (ref 30.0–36.0)
MCV: 80.6 fL (ref 78.0–100.0)
Platelets: 285 10*3/uL (ref 150–400)
RBC: 4.13 MIL/uL (ref 3.87–5.11)
RDW: 17.7 % — AB (ref 11.5–15.5)
WBC: 8.3 10*3/uL (ref 4.0–10.5)

## 2014-11-28 LAB — COMPREHENSIVE METABOLIC PANEL
ALBUMIN: 2.9 g/dL — AB (ref 3.5–5.2)
ALK PHOS: 196 U/L — AB (ref 39–117)
ALT: 284 U/L — ABNORMAL HIGH (ref 0–35)
ANION GAP: 5 (ref 5–15)
AST: 89 U/L — ABNORMAL HIGH (ref 0–37)
BILIRUBIN TOTAL: 1 mg/dL (ref 0.3–1.2)
CO2: 30 mmol/L (ref 19–32)
Calcium: 8.8 mg/dL (ref 8.4–10.5)
Chloride: 104 mmol/L (ref 96–112)
Creatinine, Ser: 0.67 mg/dL (ref 0.50–1.10)
GFR calc Af Amer: >90 mL/min (ref >90)
GFR calc non Af Amer: >90 mL/min (ref >90)
Glucose, Bld: 88 mg/dL (ref 70–99)
POTASSIUM: 3.2 mmol/L — AB (ref 3.5–5.1)
Sodium: 139 mmol/L (ref 135–145)
Total Protein: 6.5 g/dL (ref 6.0–8.3)

## 2014-11-28 LAB — CK: Total CK: 43 U/L (ref 7–177)

## 2014-11-28 LAB — HEPATITIS PANEL, ACUTE
HCV Ab: NEGATIVE
HEP B C IGM: NONREACTIVE
HEP B S AG: NEGATIVE
Hep A IgM: NONREACTIVE

## 2014-11-28 LAB — VITAMIN B12: VITAMIN B 12: 663 pg/mL (ref 211–911)

## 2014-11-28 MED ORDER — ADULT MULTIVITAMIN LIQUID CH
5.0000 mL | Freq: Every day | ORAL | Status: DC
  Administered 2014-11-28 – 2014-12-08 (×11): 5 mL via ORAL
  Filled 2014-11-28 (×11): qty 5

## 2014-11-28 MED ORDER — GLUCERNA SHAKE PO LIQD
237.0000 mL | Freq: Three times a day (TID) | ORAL | Status: DC
  Administered 2014-11-29 – 2014-12-08 (×20): 237 mL via ORAL
  Filled 2014-11-28 (×2): qty 237

## 2014-11-28 MED ORDER — BISACODYL 10 MG RE SUPP
10.0000 mg | Freq: Once | RECTAL | Status: AC
  Administered 2014-11-28: 10 mg via RECTAL
  Filled 2014-11-28: qty 1

## 2014-11-28 MED ORDER — HYDRALAZINE HCL 20 MG/ML IJ SOLN
10.0000 mg | Freq: Four times a day (QID) | INTRAMUSCULAR | Status: DC | PRN
  Administered 2014-11-28 – 2014-12-03 (×6): 10 mg via INTRAVENOUS
  Filled 2014-11-28 (×6): qty 1

## 2014-11-28 MED ORDER — PRO-STAT SUGAR FREE PO LIQD
30.0000 mL | Freq: Two times a day (BID) | ORAL | Status: DC
  Administered 2014-11-28 – 2014-12-08 (×17): 30 mL via ORAL
  Filled 2014-11-28 (×21): qty 30

## 2014-11-28 MED ORDER — PANTOPRAZOLE SODIUM 40 MG PO TBEC
40.0000 mg | DELAYED_RELEASE_TABLET | Freq: Every day | ORAL | Status: DC
  Administered 2014-11-28 – 2014-12-08 (×11): 40 mg via ORAL
  Filled 2014-11-28 (×9): qty 1

## 2014-11-28 NOTE — Progress Notes (Signed)
Occupational Therapy Treatment Patient Details Name: [name] [name] MRN: [id number] DOB: [birth year] Today's Date: 11/28/2014    History of present illness Pt is a 39 y.o. Female PMH of HTN, obesity, and s/p gastric bypass in 07/2014 admitted 11/26/14 for nausea, elevated LFT, weakness and ataxia.    OT comments  Pt continues to have difficulty requiring extensive assist for bed mobility to sit EOB and for SPT onto BSC. Pt required increased time to complete bed mobility and for sit - stand to transfer over to [facility] with +2 assist. Pt total A for LB dressing to donn socks without even attempting to perform and she stated "I can't cross my legs to do it"  Follow Up Recommendations  SNF;Supervision/Assistance - 24 hour    Equipment Recommendations  Other (comment) (TBD)    Recommendations for Other Services      Precautions / Restrictions Precautions Precautions: Fall Restrictions Weight Bearing Restrictions: No       Mobility Bed Mobility Overal bed mobility: Needs Assistance Bed Mobility: Supine to Sit;Sit to Supine     Supine to sit: Min assist     General bed mobility comments: LE assist needed  Transfers Overall transfer level: Needs assistance Equipment used: 2 person hand held assist Transfers: Sit to/from Stand Sit to Stand: Mod assist Stand pivot transfers: Mod assist       General transfer comment: Mod A to rise due to LE weakness     Balance   Sitting-balance support: No upper extremity supported;Feet supported Sitting balance-Leahy Scale: Fair     Standing balance support: Bilateral upper extremity supported;During functional activity Standing balance-Leahy Scale: Poor                     ADL               Lower Body Bathing: Moderate assistance;Sitting/lateral leans       Lower Body Dressing: Sit to/from stand;Total assistance (pt instrutec to doof/donn socks and pt stated " I can't cross my legs to do it")   Toilet Transfer: Moderate  assistance;Stand-pivot;RW;BSC             General ADL Comments: Attempted sit -stand x 3 before pt able to SPT to [facility], minimal effort being made by pt. Pt required increased time to complete bed mobility and to finally tranfer to [facility]      Vision  no change from baseline per pt report                   Perception Perception Perception Tested?: No   Praxis Praxis Praxis tested?: Not tested    Cognition   Behavior During Therapy: [facility] for tasks assessed/performed;Flat affect Overall Cognitive Status: Within Functional Limits for tasks assessed                       Extremity/Trunk Assessment   generalized weakness                        General Comments  pt pleasant    Pertinent Vitals/ Pain       Pain Assessment: No/denies pain                                            Prior Functioning/Environment  independent  Frequency Min 2X/week     Progress Toward Goals  OT Goals(current goals can now be found in the care plan section)  Progress towards OT goals: OT to reassess next treatment  Acute Rehab OT Goals Patient Stated Goal: to get stronger  Plan Discharge plan remains appropriate                     End of Session Equipment Utilized During Treatment: Other (comment);Gait belt;Rolling walker (BSC)   Activity Tolerance Patient tolerated treatment well   Patient Left in bed;with call bell/phone within reach;Other (comment)             Time: 4098-1191: 1036-1108 OT Time Calculation (min): 32 min  Charges: OT General Charges $OT Visit: 1 Procedure OT Treatments $Self Care/Home Management : 8-22 mins $Therapeutic Activity: 8-22 mins  Galen ManilaSpencer, Rosalynd Mcwright Jeanette 11/28/2014, 1:28 PM

## 2014-11-28 NOTE — Clinical Social Work Psychosocial (Signed)
Clinical Social Work Department BRIEF PSYCHOSOCIAL ASSESSMENT 11/28/2014  Patient:  [name] [name],[name] [name] [name]     Account Number:  [id number]     Admit date:  11/26/2014  Clinical Social Worker:  Lovey Newcomer  Date/Time:  11/28/2014 01:37 PM  Referred by:  Physician  Date Referred:  11/28/2014 Referred for  SNF Placement   Other Referral:   NA   Interview type:  Patient Other interview type:   Patient and patient's husband interviewed at bedside to complete assessment.    PSYCHOSOCIAL DATA Living Status:  FAMILY Admitted from facility:   Level of care:   Primary support name:  [name] [name] [name] Primary support relationship to patient:  SPOUSE Degree of support available:   Support is good, but family unable to provide level of care patient is requiring at discharge.    CURRENT CONCERNS Current Concerns  Post-Acute Placement   Other Concerns:   NA    SOCIAL WORK ASSESSMENT / PLAN CSW met with patient and husband at bedside to complete assessment. Patient was admitted from home where she lives with her husband and child. Patient and husband are expecting a discharge to SNF as they do not feel home with home health will be an option as the husband works and the daughter goes to school. CSW explained SNF search/placement process and answered questions.    Patient presents with flat affect but willing to engage in assessment. Patient seems to have fair insight into the reason for her admission. CSW senses that the patient would ultimately like to go home but understands that this is not an option at this time. Patient states that she has [place] and her husband will bring in her insurance information.   Assessment/plan status:  Psychosocial Support/Ongoing Assessment of Needs Other assessment/ plan:   Complete Fl2, Fax, PASRR   Information/referral to community resources:   CSW contact information and SNF list given.    PATIENT'S/FAMILY'S RESPONSE TO PLAN OF CARE: Patient and  patient's husband plan for patient to DC to a SNF once medically stable. Both understand that the patient may have to DC to a SNF beyond [facility] if LOG placement has to be pursued in the event insurance does not provide a timely auth. CSW will follow up with bed offers.       [name] [name] [name] [name] [name] MSW, [place], [place], [phone]

## 2014-11-28 NOTE — Progress Notes (Signed)
PROGRESS NOTE  [name] [name] ZOX:[id number] DOB: [birth year] DOA: 11/26/2014 PCP: No primary care provider on file.  HPI/Recap of past 24 hours:  Reported feeling better, no active n/v, denies pain, multiple family members at bedside. Patient Reported no bm today, however family member reported witnessed bm x2 today.  Assessment/Plan: Principal Problem:   Elevated LFTs Active Problems:   Ataxia   Obesity   Anxiety   Cholelithiasis   Elevation of LFT: unclear etiology, hepatitis panel/ab us/HIDA scan pending. GI consulted.  Hypokalemia: replace k, check mag  Confusion: MRI brain no acute finding, ammonia level wnl. Will check tsh/b12 wnl, folate level pending, thiamine level pending. Neurology consult if not improving  FTT/progressive weakness: mri brain/spine no acute findings. PT/OT/SNF  Obesity s/p gastric bypass. Reported 80pounds of weight loss since bypass procedure in 06/2014. Dietician consult.  Htn: continue cozaar, hold httz.  Code Status: full  Family Communication: patient  And family  Disposition Plan: snf   Consultants:  GI  Procedures:  HIDA/ab us/mri brain/spine  Antibiotics:  none   Objective: BP 102/74 mmHg  Pulse 115  Temp(Src) 98.4 F (36.9 C) (Oral)  Resp 16  Ht  (1.727 m)  Wt 74.208 kg (163 lb 9.6 oz)  BMI 24.88 kg/m2  SpO2 100%  LMP  (LMP Unknown)  Intake/Output Summary (Last 24 hours) at 11/28/14 1952 Last data filed at 11/28/14 1647  Gross per 24 hour  Intake    720 ml  Output   1900 ml  Net  -1180 ml   Filed Weights   11/26/14 1755  Weight: 74.208 kg (163 lb 9.6 oz)    Exam:   General:  NAD, weak  Cardiovascular: RRR  Respiratory: CTABL  Abdomen: Soft/ND/NT, positive BS  Musculoskeletal: No Edema  Neuro: not oriented to time but to place on 4/1. Poor short term memory. Generalized weakness, slight improvement in strength , able to life legs against gravity slightly, No focal deficit.  Data  Reviewed: Basic Metabolic Panel:  Recent Labs Lab 11/23/14 1701 11/23/14 1814 11/26/14 1100 11/27/14 0656 11/28/14 0608  NA 147* 149* 145 144 139  K 2.8* 3.2* 3.2* 2.9* 3.2*  CL 108 107 107 108 104  CO2 28  --  32 27 30  GLUCOSE 114* 106* 110* 97 88  BUN 24* 29* 17 8 <5*  CREATININE 0.76 0.80 0.88 0.77 0.67  CALCIUM 9.9  --  9.8 8.8 8.8   Liver Function Tests:  Recent Labs Lab 11/26/14 1100 11/27/14 0656 11/28/14 0608  AST 223* 112* 89*  ALT 520* 349* 284*  ALKPHOS 223* 185* 196*  BILITOT 1.8* 1.2 1.0  PROT 7.5 6.3 6.5  ALBUMIN 3.4* 2.9* 2.9*    Recent Labs Lab 11/23/14 1809 11/26/14 1100  LIPASE 55 48    Recent Labs Lab 11/27/14 1500  AMMONIA 31   CBC:  Recent Labs Lab 11/23/14 1701 11/23/14 1814 11/26/14 1100 11/27/14 0656 11/28/14 0608  WBC 9.5  --  6.9 7.5 8.3  NEUTROABS 5.2  --  4.2  --   --   HGB 13.5 15.0 13.1 11.3* 11.4*  HCT 39.8 44.0 38.6 32.8* 33.3*  MCV 82.1  --  81.6 81.4 80.6  PLT 294  --  274 271 285   Cardiac Enzymes:    Recent Labs Lab 11/28/14 0608  CKTOTAL 43   BNP (last 3 results) No results for input(s): BNP in the last 8760 hours.  ProBNP (last 3 results) No results for input(s): PROBNP in  the last 8760 hours.  CBG: No results for input(s): GLUCAP in the last 168 hours.  No results found for this or any previous visit (from the past 240 hour(s)).   Studies: Nm Hepatobiliary Liver Func  11/27/2014   CLINICAL DATA:  Gallstones.  EXAM: NUCLEAR MEDICINE HEPATOBILIARY IMAGING WITH GALLBLADDER EF  TECHNIQUE: Sequential images of the abdomen were obtained out to 60 minutes following intravenous administration of radiopharmaceutical. After slow intravenous infusion of 1.48 micrograms Cholecystokinin, gallbladder ejection fraction was determined.  RADIOPHARMACEUTICALS:  5.0 Millicurie Tc-5567m Choletec  COMPARISON:  Ultrasound 11/27/2014.  FINDINGS: Liver, biliary system, gallbladder, bowel visualize normally. Gallbladder  ejection fraction at 45 minutes is 16%. This is abnormal . At 45 min, normal ejection fraction is greater than 40%. Patient experienced no symptoms following administration of CCK.  IMPRESSION: 1. Liver, biliary system, gallbladder, bowel visualize normally. 2. Gallbladder ejection fraction is low at 16% at 45 minutes.   Electronically Signed   By: Maisie Fushomas  Register   On: 11/27/2014 15:42   [place] Abdomen Complete  11/27/2014   CLINICAL DATA:  Initial evaluation for elevated liver function tests with nausea and constipation  EXAM: ULTRASOUND ABDOMEN COMPLETE  COMPARISON:  None.  FINDINGS: Gallbladder: There is an extremely large volume of sludge within the lumen of the gallbladder. There may also be numerous small calculi. Gallbladder wall thickness is within normal limits. There is no sonographic Murphy sign.  Common bile duct: Diameter: 3 mm  Liver: No focal lesion identified. Within normal limits in parenchymal echogenicity.  IVC: No abnormality visualized.  Pancreas: Suboptimally characterize but grossly negative  Spleen: Size and appearance within normal limits.  Right Kidney: Length: 11 cm. 15 mm cyst midpole laterally. Otherwise negative.  Left Kidney: Length: 11 cm. Echogenicity within normal limits. No mass or hydronephrosis visualized.  Abdominal aorta: No aneurysm visualized.  Other findings: None.  IMPRESSION: Large volume of sludge within the gallbladder lumen possibly with tiny calculi associated. No gallbladder wall thickening or sonographic Murphy's sign.   Electronically Signed   By: Esperanza Heiraymond  Rubner M.D.   On: 11/27/2014 12:15    Scheduled Meds: . feeding supplement (GLUCERNA SHAKE)  237 mL Oral TID BM  . feeding supplement (PRO-STAT SUGAR FREE 64)  30 mL Oral BID  . heparin  5,000 Units Subcutaneous 3 times per day  . lactulose  20 g Oral TID  . losartan  50 mg Oral Daily  . multivitamin  5 mL Oral Daily  . pantoprazole  40 mg Oral Daily    Continuous Infusions: . 0.9 % NaCl with KCl 20  mEq / L 75 mL/hr at 11/28/14 0701     Time spent: >5035mins  Login Muckleroy MD, PhD  [facility] Pager [phone]. If 7PM-7AM, please contact night-coverage at [email or web address], password [email or web address] 11/28/2014, 7:52 PM  LOS: 2 days

## 2014-11-28 NOTE — Clinical Social Work Placement (Signed)
Clinical Social Work Department CLINICAL SOCIAL WORK PLACEMENT NOTE 11/28/2014  Patient:  Esselman,Svea  Account Number:  [id number] Admit date:  11/26/2014  Clinical Social Worker:  Lavell LusterJOSEPH BRYANT Sebastiano Luecke, LCSWA  Date/time:  11/28/2014 02:28 PM  Clinical Social Work is seeking post-discharge placement for this patient at the following level of care:   SKILLED NURSING   (*CSW will update this form in Epic as items are completed)   11/28/2014  Patient/family provided with [facility] Department of Clinical Social Work's list of facilities offering this level of care within the geographic area requested by the patient (or if unable, by the patient's family).  11/28/2014  Patient/family informed of their freedom to choose among providers that offer the needed level of care, that participate in Medicare, Medicaid or managed care program needed by the patient, have an available bed and are willing to accept the patient.  11/28/2014  Patient/family informed of [facility]' ownership interest in [facility], as well as of the fact that they are under no obligation to receive care at this facility.  PASARR submitted to EDS on 11/28/2014 PASARR number received on 11/28/2014  FL2 transmitted to all facilities in geographic area requested by pt/family on  11/28/2014 FL2 transmitted to all facilities within larger geographic area on   Patient informed that his/her managed care company has contracts with or will negotiate with  certain facilities, including the following:     Patient/family informed of bed offers received:   Patient chooses bed at  Physician recommends and patient chooses bed at    Patient to be transferred to  on   Patient to be transferred to facility by  Patient and family notified of transfer on  Name of family member notified:    The following physician request were entered in Epic:   Additional Comments:    Roddie McBryant Etai Copado MSW, [place], [place], [phone]

## 2014-11-28 NOTE — Progress Notes (Addendum)
Daily Rounding Note  11/28/2014, 8:44 AM  LOS: 2 days   SUBJECTIVE:       Not eating much of the FL meals.  Pt denies nausea but just not that hungry. No stools yet despite 3 doses of Lactulose.   RN reporting pt weaker and more difficulty getting up to BS comode   OBJECTIVE:         Vital signs in last 24 hours:    Temp:  [98.2 F (36.8 C)-99 F (37.2 C)] 98.2 F (36.8 C) (04/01 0513) Pulse Rate:  [93-101] 93 (04/01 0513) Resp:  [18] 18 (04/01 0513) BP: (128-161)/(98-117) 160/100 mmHg (04/01 0518) SpO2:  [100 %] 100 % (04/01 0513) Last BM Date: 11/12/14 Filed Weights   11/26/14 1755  Weight: 163 lb 9.6 oz (74.208 kg)   General: looks well.     Heart: RRR Chest: clear bil. No cough or dyspnea Abdomen: soft, NT, ND.  BS active.    Extremities: no CCE Neuro/Psych:  [facility].  Seems a bit sharper today  Intake/Output from previous day: 03/31 0701 - 04/01 0700 In: 120 [P.O.:120] Out: 2100 [Urine:2100]  Intake/Output this shift:    Lab Results:  Recent Labs  11/26/14 1100 11/27/14 0656 11/28/14 0608  WBC 6.9 7.5 8.3  HGB 13.1 11.3* 11.4*  HCT 38.6 32.8* 33.3*  PLT 274 271 285   BMET  Recent Labs  11/26/14 1100 11/27/14 0656  NA 145 144  K 3.2* 2.9*  CL 107 108  CO2 32 27  GLUCOSE 110* 97  BUN 17 8  CREATININE 0.88 0.77  CALCIUM 9.8 8.8   LFT  Recent Labs  11/26/14 1100 11/27/14 0656  PROT 7.5 6.3  ALBUMIN 3.4* 2.9*  AST 223* 112*  ALT 520* 349*  ALKPHOS 223* 185*  BILITOT 1.8* 1.2       ASSESMENT:   * Abnormal LFTs, improving since admission. No abdominal pain but need to rule out gallstones, biliary pathology. ? Drug reaction (DILI) Stable lesions of liver of unclear diagnosis, seen on CT in Feb and March 2016. Ultrasound with GB sludge, HIDA with decreased GB EF.  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   * Hx morbid obesity.  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 Weight drop of 80 #. Issues with post op N/V which pt claims have improved, however not convinced of this improvement.   *  Depression: adjustment d/o per 3/30 LCSW psych note. Depression may explain her psycho-motor retardation/confusion.  Ammonia level is normal so this is not HE.  Note she had been started on Doxepin in 09/2014 but was not taking this at time of current admission.   * Weakness  * Constipation. Lactulose added 3/31.   *  HTN.    PLAN   *  Supportive care.  Await results of pending tests.  Advance to mech soft diet. Continue BID Ensure.  Will give Dulcolax PR today and continue Lactulose.  Wonder if psych could make rec re: an antidepressant; one that is less likely to cause nausea, constipation and may give her appetite a boost?   Jennye MoccasinSarah Gribbin  11/28/2014, 8:44 AM Pager: [phone]  Agree with Ms. Heron NayGribbin's note. Cause of abnl LFT's not entirely clear to me at this point. Fatty liver part or all probably. Discussed with Dr. Roda ShuttersXu - plan neurology evaluation given mental status changes. Will follow up  1-2 days after neuro sees her. Note that do not think a gastric sleeve procedure (not a bypass) usually leads to mineral, vitamin deficiencies.  Iva Boop, MD, [facility] [phone] (pager) 11/28/2014 9:45 PM

## 2014-11-28 NOTE — Progress Notes (Signed)
Pts BP elevated at 160/100. On call provider notified, 10mg  of hydralazine ordered and given. Will continue to monitor.

## 2014-11-28 NOTE — Progress Notes (Addendum)
INITIAL NUTRITION ASSESSMENT  DOCUMENTATION CODES Per approved criteria  -Not Applicable   INTERVENTION: -D/c Ensure Enlive po BID, each supplement provides 350 kcal and 20 grams of protein -Glucerna Shake po TID, each supplement provides 220 kcal and 10 grams of protein  -30 ml Prostat TID, each supplement provides 100 kcals and 10 grams of protein -Liquid MVI daily  NUTRITION DIAGNOSIS: Inadequate oral intake related to n/v, decreased appetite as evidenced by PO <25%.   Goal: Pt will meet >90% of estimated nutritional needs  Monitor:  Diet advancement, PO/supplement intake, labs, weight changes, I/O's  Reason for Assessment: Consult to assess needs  39 y.o. female  Admitting Dx: Elevated LFTs  [name] [name] is a 39 y.o. female  With a hx of morbid obesity s/p gastric bypass in 12/15, HTN, who presents with progressive nausea and weakness. In the ED, pt noted to have difficulty ambulating. She was noted to have increased LFT's: alk phos of 223, alt 520, and ast 223 with bili of 1.8. GI was consulted through the ED and hospitalist consulted for admission.  ASSESSMENT: Pt admitted with elevated LFTs.  Pt somnolent at time of visit. Per RN, pt has been difficult to arouse all day.  Nutrition-focused physical exam unable to be completed at this time. Noted depletion in orbital and temple areas. Spoke with pt husband at bedside, who is very involved in pt care. He confirms that pt had gastric sleeve done at [facility] in 06/2014. Since surgery, pt has had a poor appetite. Pt husband estimates that pt has lost 80-100# since the surgery. He reveals that since this past Friday, pt has been vomiting up all PO intake. Although he confirms that intake is very little since hospitalization, he reports improvement due to pt able to keep food down. PTA, pt husband reports that pt was receiving a liquid MVI daily and 30 ml liqui-cell supplement TID (which provides 16 grams of protein with each  supplement). Pt has been consuming Ensure supplement, however, husband reports that he has been "forcing" her to consume them. RN confirms that pt has been consuming very little of the supplements.  Reviewed formulary of available supplements with pt husband. He agrees to try Glucerna (due to decreased sugar content) and Prostat (due to pt taking similar product at home). Will also add liquid MVI for consistency.  Also discussed plan with RN.  Labs reviewed. K: 3.2, BUN <5.   Height: Ht Readings from Last 1 Encounters:  11/26/14 _0  (1.727 m)    Weight: Wt Readings from Last 1 Encounters:  11/26/14 163 lb 9.6 oz (74.208 kg)    Ideal Body Weight: 140#  % Ideal Body Weight: 114%  Wt Readings from Last 10 Encounters:  11/26/14 163 lb 9.6 oz (74.208 kg)    Usual Body Weight: 263#  % Usual Body Weight: 61%  BMI:  Body mass index is 24.88 kg/(m^2). Normal weight range  Estimated Nutritional Needs: Kcal: 2000-2200 Protein: 115-125 grams Fluid: 2.0-2.2 L  Skin: WDL  Diet Order: Diet full liquid Room service appropriate?: Yes; Fluid consistency:: Thin  EDUCATION NEEDS: -Education needs addressed   Intake/Output Summary (Last 24 hours) at 11/28/14 1447 Last data filed at 11/28/14 0939  Gross per 24 hour  Intake    360 ml  Output   2150 ml  Net  -1790 ml    Last BM: 11/28/14  Labs:   Recent Labs Lab 11/26/14 1100 11/27/14 0656 11/28/14 0608  NA 145 144 139  K  3.2* 2.9* 3.2*  CL 107 108 104  CO2 32 27 30  BUN 17 8 <5*  CREATININE 0.88 0.77 0.67  CALCIUM 9.8 8.8 8.8  GLUCOSE 110* 97 88    CBG (last 3)  No results for input(s): GLUCAP in the last 72 hours.  Scheduled Meds: . feeding supplement (ENSURE ENLIVE)  237 mL Oral BID BM  . heparin  5,000 Units Subcutaneous 3 times per day  . lactulose  20 g Oral TID  . losartan  50 mg Oral Daily  . pantoprazole  40 mg Oral Daily    Continuous Infusions: . 0.9 % NaCl with KCl 20 mEq / L 75 mL/hr at 11/28/14  0701    Past Medical History  Diagnosis Date  . Hypertension   . ARF (acute renal failure) 09/2014    admitted with electrolyte disturbance, ARF to [facility].   Marland Kitchen Positive H. pylori test 06/2014    serum H pylori + and treated.     Past Surgical History  Procedure Laterality Date  . 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 Endometrial ablation w/ novasure    . Tubal ligation  2002  . Esophagogastroduodenoscopy  2016    Dr Tora Duck of [facility].  found gastritis, erosion at bypass surgery site.     [name] [name], RD, LDN, CDE Pager: [phone] After hours Pager: [phone]

## 2014-11-29 LAB — MAGNESIUM: MAGNESIUM: 1.6 mg/dL (ref 1.5–2.5)

## 2014-11-29 LAB — COMPREHENSIVE METABOLIC PANEL
ALBUMIN: 2.6 g/dL — AB (ref 3.5–5.2)
ALK PHOS: 193 U/L — AB (ref 39–117)
ALT: 238 U/L — AB (ref 0–35)
ANION GAP: 5 (ref 5–15)
AST: 88 U/L — ABNORMAL HIGH (ref 0–37)
BUN: <5 mg/dL — ABNORMAL LOW (ref 6–23)
CALCIUM: 8.6 mg/dL (ref 8.4–10.5)
CHLORIDE: 111 mmol/L (ref 96–112)
CO2: 25 mmol/L (ref 19–32)
Creatinine, Ser: 0.61 mg/dL (ref 0.50–1.10)
GFR calc Af Amer: >90 mL/min (ref >90)
Glucose, Bld: 95 mg/dL (ref 70–99)
POTASSIUM: 3.5 mmol/L (ref 3.5–5.1)
Sodium: 141 mmol/L (ref 135–145)
Total Bilirubin: 0.8 mg/dL (ref 0.3–1.2)
Total Protein: 5.7 g/dL — ABNORMAL LOW (ref 6.0–8.3)

## 2014-11-29 LAB — CBC
HCT: 30.2 % — ABNORMAL LOW (ref 36.0–46.0)
Hemoglobin: 10.4 g/dL — ABNORMAL LOW (ref 12.0–15.0)
MCH: 27.4 pg (ref 26.0–34.0)
MCHC: 34.4 g/dL (ref 30.0–36.0)
MCV: 79.7 fL (ref 78.0–100.0)
PLATELETS: 302 10*3/uL (ref 150–400)
RBC: 3.79 MIL/uL — ABNORMAL LOW (ref 3.87–5.11)
RDW: 18.1 % — ABNORMAL HIGH (ref 11.5–15.5)
WBC: 8.2 10*3/uL (ref 4.0–10.5)

## 2014-11-29 LAB — ANTI-SMOOTH MUSCLE ANTIBODY, IGG: F-ACTIN AB IGG: 17 U (ref 0–19)

## 2014-11-29 LAB — CERULOPLASMIN: CERULOPLASMIN: 32.8 mg/dL (ref 19.0–39.0)

## 2014-11-29 MED ORDER — MAGNESIUM SULFATE 2 GM/50ML IV SOLN
2.0000 g | Freq: Once | INTRAVENOUS | Status: AC
  Administered 2014-11-29: 2 g via INTRAVENOUS
  Filled 2014-11-29: qty 50

## 2014-11-29 MED ORDER — METOPROLOL TARTRATE 12.5 MG HALF TABLET
12.5000 mg | ORAL_TABLET | Freq: Two times a day (BID) | ORAL | Status: DC
  Administered 2014-11-29 – 2014-11-30 (×2): 12.5 mg via ORAL
  Filled 2014-11-29 (×3): qty 1

## 2014-11-29 NOTE — Progress Notes (Signed)
Pt. is complaining of some numbness in her legs this am, & still also having the tingling.  Dr. Red Christianshui text paged.  Will continue to monitor and await for a return call or new orders.  Forbes Cellarelcine Jaquez Farrington, RN

## 2014-11-29 NOTE — Progress Notes (Signed)
PROGRESS NOTE  [name] [name] ZOX:[id number] DOB: 02/02/1976 DOA: 11/26/2014 PCP: No primary care provider on file.  HPI/Recap of past 24 hours:  Reported feeling better, no active n/v, denies pain,  Confusion/confabulation?  Assessment/Plan: Principal Problem:   Elevated LFTs Active Problems:   Ataxia   Obesity   Anxiety   Cholelithiasis   Elevation of LFT: unclear etiology, work up in progress. GI consulted.  Hypokalemia/hypomagnesemia: replace k, replace mag  Confusion: MRI brain no acute finding, ammonia level wnl. Will check tsh/b12 wnl, folate level pending, thiamine level pending. Confabulation? Anxiety? Psych consulted.  FTT/progressive weakness: mri brain/spine no acute findings. PT/OT/SNF  Obesity s/p gastric bypass. Reported 80pounds of weight loss since bypass procedure in 06/2014. Dietician consult.  Htn: continue cozaar, hold httz. Start lopressor due to persistent sinus tachycardia  Code Status: full  Family Communication: patient   Disposition Plan: snf   Consultants:  GI psych Procedures:  HIDA/ab us/mri brain/spine  Antibiotics:  none   Objective: BP 150/100 mmHg  Pulse 116  Temp(Src) 98.6 F (37 C) (Oral)  Resp 16  Ht  (1.727 m)  Wt 74.208 kg (163 lb 9.6 oz)  BMI 24.88 kg/m2  SpO2 100%  LMP  (LMP Unknown)  Intake/Output Summary (Last 24 hours) at 11/29/14 1930 Last data filed at 11/29/14 1604  Gross per 24 hour  Intake      0 ml  Output   1750 ml  Net  -1750 ml   Filed Weights   11/26/14 1755  Weight: 74.208 kg (163 lb 9.6 oz)    Exam:   General:  NAD, weak  Cardiovascular: RRR  Respiratory: CTABL  Abdomen: Soft/ND/NT, positive BS  Musculoskeletal: No Edema  Neuro: not oriented to time or place today. Poor short term memory. Generalized weakness, slight improvement in strength , able to life legs against gravity slightly, No focal deficit.  Data Reviewed: Basic Metabolic Panel:  Recent Labs Lab  11/23/14 1701 11/23/14 1814 11/26/14 1100 11/27/14 0656 11/28/14 0608 11/29/14 0543  NA 147* 149* 145 144 139 141  K 2.8* 3.2* 3.2* 2.9* 3.2* 3.5  CL 108 107 107 108 104 111  CO2 28  --  32 GLUCOSE 114* 106* 110* 97 88 95  BUN 24* 29* 17 8 <5* <5*  CREATININE 0.76 0.80 0.88 0.77 0.67 0.61  CALCIUM 9.9  --  9.8 8.8 8.8 8.6  MG  --   --   --   --   --  1.6   Liver Function Tests:  Recent Labs Lab 11/26/14 1100 11/27/14 0656 11/28/14 0608 11/29/14 0543  AST 223* 112* 89* 88*  ALT 520* 349* 284* 238*  ALKPHOS 223* 185* 196* 193*  BILITOT 1.8* 1.2 1.0 0.8  PROT 7.5 6.3 6.5 5.7*  ALBUMIN 3.4* 2.9* 2.9* 2.6*    Recent Labs Lab 11/23/14 1809 11/26/14 1100  LIPASE 55 48    Recent Labs Lab 11/27/14 1500  AMMONIA 31   CBC:  Recent Labs Lab 11/23/14 1701 11/23/14 1814 11/26/14 1100 11/27/14 0656 11/28/14 0608 11/29/14 0543  WBC 9.5  --  6.9 7.5 8.3 8.2  NEUTROABS 5.2  --  4.2  --   --   --   HGB 13.5 15.0 13.1 11.3* 11.4* 10.4*  HCT 39.8 44.0 38.6 32.8* 33.3* 30.2*  MCV 82.1  --  81.6 81.4 80.6 79.7  PLT 294  --  274 271 285 302   Cardiac Enzymes:    Recent Labs  Lab 11/28/14 0608  CKTOTAL 43   BNP (last 3 results) No results for input(s): BNP in the last 8760 hours.  ProBNP (last 3 results) No results for input(s): PROBNP in the last 8760 hours.  CBG: No results for input(s): GLUCAP in the last 168 hours.  No results found for this or any previous visit (from the past 240 hour(s)).   Studies: No results found.  Scheduled Meds: . feeding supplement (GLUCERNA SHAKE)  237 mL Oral TID BM  . feeding supplement (PRO-STAT SUGAR FREE 64)  30 mL Oral BID  . heparin  5,000 Units Subcutaneous 3 times per day  . lactulose  20 g Oral TID  . losartan  50 mg Oral Daily  . multivitamin  5 mL Oral Daily  . pantoprazole  40 mg Oral Daily    Continuous Infusions: . 0.9 % NaCl with KCl 20 mEq / L 1,000 mL (11/29/14 0950)     Time spent:  >1035mins  Julliette Frentz MD, PhD  [facility] Pager [phone]. If 7PM-7AM, please contact night-coverage at [email or web address], password [email or web address] 11/29/2014, 7:30 PM  LOS: 3 days

## 2014-11-30 DIAGNOSIS — R627 Adult failure to thrive: Secondary | ICD-10-CM | POA: Insufficient documentation

## 2014-11-30 DIAGNOSIS — F4323 Adjustment disorder with mixed anxiety and depressed mood: Secondary | ICD-10-CM

## 2014-11-30 DIAGNOSIS — I471 Supraventricular tachycardia: Secondary | ICD-10-CM

## 2014-11-30 DIAGNOSIS — R000 Tachycardia, unspecified: Secondary | ICD-10-CM | POA: Insufficient documentation

## 2014-11-30 DIAGNOSIS — I10 Essential (primary) hypertension: Secondary | ICD-10-CM

## 2014-11-30 DIAGNOSIS — R410 Disorientation, unspecified: Secondary | ICD-10-CM

## 2014-11-30 LAB — COMPREHENSIVE METABOLIC PANEL
ALT: 216 U/L — ABNORMAL HIGH (ref 0–35)
AST: 84 U/L — AB (ref 0–37)
Albumin: 2.9 g/dL — ABNORMAL LOW (ref 3.5–5.2)
Alkaline Phosphatase: 193 U/L — ABNORMAL HIGH (ref 39–117)
Anion gap: 9 (ref 5–15)
BILIRUBIN TOTAL: 0.8 mg/dL (ref 0.3–1.2)
BUN: 5 mg/dL — ABNORMAL LOW (ref 6–23)
CO2: 23 mmol/L (ref 19–32)
CREATININE: 0.79 mg/dL (ref 0.50–1.10)
Calcium: 8.8 mg/dL (ref 8.4–10.5)
Chloride: 105 mmol/L (ref 96–112)
Glucose, Bld: 108 mg/dL — ABNORMAL HIGH (ref 70–99)
POTASSIUM: 3.6 mmol/L (ref 3.5–5.1)
SODIUM: 137 mmol/L (ref 135–145)
Total Protein: 6.5 g/dL (ref 6.0–8.3)

## 2014-11-30 LAB — MITOCHONDRIAL ANTIBODIES: Mitochondrial M2 Ab, IgG: 5.7 Units (ref 0.0–20.0)

## 2014-11-30 LAB — MAGNESIUM: MAGNESIUM: 2.1 mg/dL (ref 1.5–2.5)

## 2014-11-30 MED ORDER — METOPROLOL TARTRATE 25 MG PO TABS
25.0000 mg | ORAL_TABLET | Freq: Two times a day (BID) | ORAL | Status: DC
  Administered 2014-11-30 – 2014-12-08 (×16): 25 mg via ORAL
  Filled 2014-11-30 (×17): qty 1

## 2014-11-30 MED ORDER — ZOLPIDEM TARTRATE 5 MG PO TABS
5.0000 mg | ORAL_TABLET | Freq: Every day | ORAL | Status: DC
  Administered 2014-11-30 – 2014-12-07 (×8): 5 mg via ORAL
  Filled 2014-11-30 (×8): qty 1

## 2014-11-30 MED ORDER — FOLIC ACID 1 MG PO TABS
1.0000 mg | ORAL_TABLET | Freq: Every day | ORAL | Status: DC
  Administered 2014-11-30 – 2014-12-08 (×9): 1 mg via ORAL
  Filled 2014-11-30 (×9): qty 1

## 2014-11-30 MED ORDER — VITAMIN B-1 100 MG PO TABS
100.0000 mg | ORAL_TABLET | Freq: Every day | ORAL | Status: DC
  Administered 2014-11-30 – 2014-12-03 (×4): 100 mg via ORAL
  Filled 2014-11-30 (×4): qty 1

## 2014-11-30 MED ORDER — SERTRALINE HCL 50 MG PO TABS
50.0000 mg | ORAL_TABLET | Freq: Every day | ORAL | Status: DC
  Administered 2014-11-30 – 2014-12-08 (×9): 50 mg via ORAL
  Filled 2014-11-30 (×9): qty 1

## 2014-11-30 MED ORDER — VITAMIN B-12 100 MCG PO TABS
50.0000 ug | ORAL_TABLET | Freq: Every day | ORAL | Status: DC
Start: 2014-11-30 — End: 2014-12-03
  Administered 2014-11-30 – 2014-12-03 (×4): 50 ug via ORAL
  Filled 2014-11-30 (×4): qty 1

## 2014-11-30 NOTE — Social Work (Signed)
CSW provided patient with list of facilities in which she has been accepted for Short term rehab. [facility].  CSW will follow up with discharge during the week.  Beverly Sessionsywan J Indigo Chaddock MSW, LCSW [phone]

## 2014-11-30 NOTE — Consult Note (Addendum)
[facility] Face-to-Face Psychiatry Consult   Reason for Consult: anxiety, confusion Referring Physician: Dr. Erlinda Hong Patient Identification: [name] [name] MRN:  [id number] Principal Diagnosis: Adjustment disorder with mixed anxiety and depressed mood Diagnosis:   Patient Active Problem List   Diagnosis Date Noted  . Adjustment disorder with mixed anxiety and depressed mood [F43.23] 11/30/2014    Priority: High  . FTT (failure to thrive) in adult [R62.7]   . Confusion [R41.0]   . Essential hypertension [I10]   . Sinus tachycardia [I47.1]   . Ataxia [R27.0] 11/26/2014  . Elevated LFTs [R79.89] 11/26/2014  . Obesity [E66.9] 11/26/2014  . Anxiety [F41.9] 11/26/2014  . Cholelithiasis [K80.20] 11/26/2014    Total Time spent with patient: 1 hour  Subjective:   [name] [name] is a 39 y.o. female patient admitted with progressive weakness, nausea and difficulty ambulating.  HPI:  Patient is seen, interviewed and her chart reviewed. Patient with history of morbid obesity and hypertension who denies prior history of mental illness. She reports that she has a weight loss surgery in Nov, 2015 and since then she has been finding it difficult to adjust to her previous level of functioning. She states that she has been having increased anxiety, worries, apprehension, depressive symptoms, low energy level, lack of motivation and difficulty sleeping. Today, patient is alert and oriented to time, place and person. She denies suicidal thoughts, psychosis or delusional symptoms. She agreed to a trial of antidepressant and counseling. Patient denies any family stressors, says she is happily married, employed and has 2 great children.  HPI Elements:   Location:  anxiety, depression, weakness. Quality:  mild to moderate. Duration:  since November, 2015. Context:  post bariatric surgery.  Past Medical History:  Past Medical History  Diagnosis Date  . Hypertension   . ARF (acute renal failure) 09/2014    admitted with  electrolyte disturbance, ARF to [facility].   Marland Kitchen Positive H. pylori test 06/2014    serum H pylori + and treated.     Past Surgical History  Procedure Laterality Date  . 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 Endometrial ablation w/ novasure    . Tubal ligation  2002  . Esophagogastroduodenoscopy  2016    Dr Tora Duck of [facility].  found gastritis, erosion at bypass surgery site.    Family History: History reviewed. No pertinent family history. Social History:  History  Alcohol Use No     History  Drug Use No    History   Social History  . Marital Status: Married    Spouse Name: N/A  . Number of Children: N/A  . Years of Education: N/A   Social History Main Topics  . Smoking status: Never Smoker   . Smokeless tobacco: Not on file  . Alcohol Use: No  . Drug Use: No  . Sexual Activity: Not on file   Other Topics Concern  . None   Social History Narrative   Additional Social History:                          Allergies:   Allergies  Allergen Reactions  . Levaquin [Levofloxacin In D5w] Rash  . Penicillins Itching and Rash    Rash on lips/itch    Labs:  Results for orders placed or performed during the hospital encounter of 11/26/14 (from the past 48 hour(s))  CBC     Status: Abnormal  Collection Time: 11/29/14  5:43 AM  Result Value Ref Range   WBC 8.2 4.0 - 10.5 K/uL   RBC 3.79 (L) 3.87 - 5.11 MIL/uL   Hemoglobin 10.4 (L) 12.0 - 15.0 g/dL   HCT 30.2 (L) 36.0 - 46.0 %   MCV 79.7 78.0 - 100.0 fL   MCH 27.4 26.0 - 34.0 pg   MCHC 34.4 30.0 - 36.0 g/dL   RDW 18.1 (H) 11.5 - 15.5 %   Platelets 302 150 - 400 K/uL  Comprehensive metabolic panel     Status: Abnormal   Collection Time: 11/29/14  5:43 AM  Result Value Ref Range   Sodium 141 135 - 145 mmol/L   Potassium 3.5 3.5 - 5.1 mmol/L   Chloride 111 96 - 112 mmol/L   CO2 25 19 - 32 mmol/L   Glucose, Bld 95 70 - 99 mg/dL   BUN <5 (L)  6 - 23 mg/dL   Creatinine, Ser 0.61 0.50 - 1.10 mg/dL   Calcium 8.6 8.4 - 10.5 mg/dL   Total Protein 5.7 (L) 6.0 - 8.3 g/dL   Albumin 2.6 (L) 3.5 - 5.2 g/dL   AST 88 (H) 0 - 37 U/L   ALT 238 (H) 0 - 35 U/L   Alkaline Phosphatase 193 (H) 39 - 117 U/L   Total Bilirubin 0.8 0.3 - 1.2 mg/dL   GFR calc non Af Amer >90 >90 mL/min   GFR calc Af Amer >90 >90 mL/min    Comment: (NOTE) The eGFR has been calculated using the CKD EPI equation. This calculation has not been validated in all clinical situations. eGFR's persistently <90 mL/min signify possible Chronic Kidney Disease.    Anion gap 5 5 - 15  Magnesium     Status: None   Collection Time: 11/29/14  5:43 AM  Result Value Ref Range   Magnesium 1.6 1.5 - 2.5 mg/dL  Comprehensive metabolic panel     Status: Abnormal   Collection Time: 11/30/14 10:55 AM  Result Value Ref Range   Sodium 137 135 - 145 mmol/L   Potassium 3.6 3.5 - 5.1 mmol/L   Chloride 105 96 - 112 mmol/L   CO2 23 19 - 32 mmol/L   Glucose, Bld 108 (H) 70 - 99 mg/dL   BUN 5 (L) 6 - 23 mg/dL   Creatinine, Ser 0.79 0.50 - 1.10 mg/dL   Calcium 8.8 8.4 - 10.5 mg/dL   Total Protein 6.5 6.0 - 8.3 g/dL   Albumin 2.9 (L) 3.5 - 5.2 g/dL   AST 84 (H) 0 - 37 U/L   ALT 216 (H) 0 - 35 U/L   Alkaline Phosphatase 193 (H) 39 - 117 U/L   Total Bilirubin 0.8 0.3 - 1.2 mg/dL   GFR calc non Af Amer >90 >90 mL/min   GFR calc Af Amer >90 >90 mL/min    Comment: (NOTE) The eGFR has been calculated using the CKD EPI equation. This calculation has not been validated in all clinical situations. eGFR's persistently <90 mL/min signify possible Chronic Kidney Disease.    Anion gap 9 5 - 15  Magnesium     Status: None   Collection Time: 11/30/14 10:55 AM  Result Value Ref Range   Magnesium 2.1 1.5 - 2.5 mg/dL    Vitals: Blood pressure 158/105, pulse 113, temperature 98.6 F (37 C), temperature source Oral, resp. rate 18, height 5' 8"  (1.727 m), weight 74.208 kg (163 lb 9.6 oz), SpO2 99  %.  Risk to Self:  Is patient at risk for suicide?: No Risk to Others:   Prior Inpatient Therapy:   Prior Outpatient Therapy:    Current Facility-Administered Medications  Medication Dose Route Frequency Provider Last Rate Last Dose  . 0.9 % NaCl with KCl 20 mEq/ L  infusion   Intravenous Continuous Florencia Reasons, MD 75 mL/hr at 11/30/14 1406 1,000 mL at 11/30/14 1406  . feeding supplement (GLUCERNA SHAKE) (GLUCERNA SHAKE) liquid 237 mL  237 mL Oral TID BM Jenifer A Williams, RD   237 mL at 11/30/14 1642  . feeding supplement (PRO-STAT SUGAR FREE 64) liquid 30 mL  30 mL Oral BID Jenifer A Williams, RD   30 mL at 11/30/14 0938  . folic acid (FOLVITE) tablet 1 mg  1 mg Oral Daily Florencia Reasons, MD   1 mg at 11/30/14 1356  . heparin injection 5,000 Units  5,000 Units Subcutaneous 3 times per day Donne Hazel, MD   5,000 Units at 11/30/14 1455  . hydrALAZINE (APRESOLINE) injection 10 mg  10 mg Intravenous Q6H PRN Dianne Dun, NP   10 mg at 11/29/14 0525  . lactulose (CHRONULAC) 10 GM/15ML solution 20 g  20 g Oral TID Vena Rua, PA-C   20 g at 11/30/14 1642  . losartan (COZAAR) tablet 50 mg  50 mg Oral Daily Donne Hazel, MD   50 mg at 11/30/14 1438  . metoprolol tartrate (LOPRESSOR) tablet 25 mg  25 mg Oral BID Florencia Reasons, MD      . multivitamin liquid 5 mL  5 mL Oral Daily Jenifer A Williams, RD   5 mL at 11/30/14 0938  . ondansetron (ZOFRAN) tablet 4 mg  4 mg Oral Q6H PRN Donne Hazel, MD       Or  . ondansetron [facility]) injection 4 mg  4 mg Intravenous Q6H PRN Donne Hazel, MD      . pantoprazole (PROTONIX) EC tablet 40 mg  40 mg Oral Daily Florencia Reasons, MD   40 mg at 11/30/14 8875  . thiamine (VITAMIN B-1) tablet 100 mg  100 mg Oral Daily Florencia Reasons, MD   100 mg at 11/30/14 1356  . vitamin B-12 (CYANOCOBALAMIN) tablet 50 mcg  50 mcg Oral Daily Florencia Reasons, MD   50 mcg at 11/30/14 1357    Musculoskeletal: Strength & Muscle Tone: within normal limits Gait & Station: normal Patient leans:  N/A  Psychiatric Specialty Exam: Physical Exam  Psychiatric: Her speech is normal and behavior is normal. Judgment and thought content normal. Her mood appears anxious. She exhibits a depressed mood.    Review of Systems  Constitutional: Positive for malaise/fatigue.  HENT: Negative.   Eyes: Negative.   Respiratory: Negative.   Cardiovascular: Negative.   Genitourinary: Negative.   Musculoskeletal: Positive for myalgias.  Skin: Negative.   Neurological: Positive for weakness.  Endo/Heme/Allergies: Negative.   Psychiatric/Behavioral: Positive for depression. The patient is nervous/anxious and has insomnia.     Blood pressure 158/105, pulse 113, temperature 98.6 F (37 C), temperature source Oral, resp. rate 18, height 5' 8"  (1.727 m), weight 74.208 kg (163 lb 9.6 oz), SpO2 99 %.Body mass index is 24.88 kg/(m^2).  General Appearance: Casual and dressed in hospital gown  Eye Contact::  Good  Speech:  Normal Rate  Volume:  Decreased  Mood:  Anxious and Depressed  Affect:  Constricted  Thought Process:  Goal Directed  Orientation:  Full (Time, Place, and Person)  Thought Content:  Negative  Suicidal  Thoughts:  No  Homicidal Thoughts:  No  Memory:  Immediate;   Good Recent;   Good Remote;   Good  Judgement:  Fair  Insight:  Fair  Psychomotor Activity:  Decreased  Concentration:  Fair  Recall:  Good  Fund of Knowledge:Good  Language: Good  Akathisia:  No  Handed:  Right  AIMS (if indicated):     Assets:  Communication Skills Desire for Improvement Social Support  ADL's:  Intact  Cognition: WNL  Sleep:   poor   Medical Decision Making: Established Problem, Stable/Improving (1), Review or order clinical lab tests (1), Review of Medication Regimen & Side Effects (2) and Review of New Medication or Change in Dosage (2)  Treatment Plan Summary: Daily contact with patient to assess and evaluate symptoms and progress in treatment.  Medication management: Initiate Sertraline  4m daily for anxiety/depression.  Initiate Ambien 511mpo Qhs as needed for insomnia  Plan:  No evidence of imminent risk to self or others at present.   Patient does not meet criteria for psychiatric inpatient admission.  Patient will benefit from counseling and continuation of antidepressant when discharged. Disposition: Patient is psychiatrically cleared. Pls call consult if our help is needed in the future.  AkCorena PilgrimMD 11/30/2014 5:16 PM

## 2014-11-30 NOTE — Progress Notes (Signed)
PROGRESS NOTE  [name] [name] EAV:[id number] DOB: 05/20/1976 DOA: 11/26/2014 PCP: No primary care provider on file.  HPI/Recap of past 24 hours:  Seems more interactive today, still mild confusion, no active n/v, denies pain,    Assessment/Plan: Principal Problem:   Elevated LFTs Active Problems:   Ataxia   Obesity   Anxiety   Cholelithiasis   Elevation of LFT: unclear etiology, work up in progress. GI consulted.  Hypokalemia/hypomagnesemia: replace k, replace mag. Continue gentle hydration.  Confusion: MRI brain no acute finding, ammonia level wnl. Will check tsh/b12 wnl, folate level pending, thiamine level pending. Start b12/folic acid/thamine supplement on 4/3. Confabulation? Anxiety? Awaiting Psych to see patient. Talked to psych coordinator at [phone] on Saturday.  HTN: continue cozaar, hold hctz. increase lopressor due to persistent sinus tachycardia. TSH wnl.  FTT/progressive weakness: mri brain/spine no acute findings. PT/OT/SNF  Obesity s/p gastric bypass. Reported 80pounds of weight loss since bypass procedure in 06/2014. Dietician consult.    Code Status: full  Family Communication: patient   Disposition Plan: snf   Consultants:  GI psych Procedures:  HIDA/ab us/mri brain/spine  Antibiotics:  none   Objective: BP 158/105 mmHg  Pulse 113  Temp(Src) 98.6 F (37 C) (Oral)  Resp 18  Ht 5\' 8"  (1.727 m)  Wt 74.208 kg (163 lb 9.6 oz)  BMI 24.88 kg/m2  SpO2 99%  LMP  (LMP Unknown)  Intake/Output Summary (Last 24 hours) at 11/30/14 1608 Last data filed at 11/30/14 1406  Gross per 24 hour  Intake 5142.5 ml  Output   2100 ml  Net 3042.5 ml   Filed Weights   11/26/14 1755  Weight: 74.208 kg (163 lb 9.6 oz)    Exam:   General:  NAD, weak  Cardiovascular: RRR  Respiratory: CTABL  Abdomen: Soft/ND/NT, positive BS  Musculoskeletal: No Edema  Neuro: not oriented to time or place today. Poor short term memory. Generalized weakness,  slight improvement in strength , able to life legs against gravity slightly, No focal deficit.  Data Reviewed: Basic Metabolic Panel:  Recent Labs Lab 11/26/14 1100 11/27/14 0656 11/28/14 0608 11/29/14 0543 11/30/14 1055  NA 145 144 139 141 137  K 3.2* 2.9* 3.2* 3.5 3.6  CL 107 108 104 111 105  CO2 32 27 30 25 23   GLUCOSE 110* 97 88 95 108*  BUN 17 8 <5* <5* 5*  CREATININE 0.88 0.77 0.67 0.61 0.79  CALCIUM 9.8 8.8 8.8 8.6 8.8  MG  --   --   --  1.6 2.1   Liver Function Tests:  Recent Labs Lab 11/26/14 1100 11/27/14 0656 11/28/14 0608 11/29/14 0543 11/30/14 1055  AST 223* 112* 89* 88* 84*  ALT 520* 349* 284* 238* 216*  ALKPHOS 223* 185* 196* 193* 193*  BILITOT 1.8* 1.2 1.0 0.8 0.8  PROT 7.5 6.3 6.5 5.7* 6.5  ALBUMIN 3.4* 2.9* 2.9* 2.6* 2.9*    Recent Labs Lab 11/23/14 1809 11/26/14 1100  LIPASE 55 48    Recent Labs Lab 11/27/14 1500  AMMONIA 31   CBC:  Recent Labs Lab 11/23/14 1701 11/23/14 1814 11/26/14 1100 11/27/14 0656 11/28/14 0608 11/29/14 0543  WBC 9.5  --  6.9 7.5 8.3 8.2  NEUTROABS 5.2  --  4.2  --   --   --   HGB 13.5 15.0 13.1 11.3* 11.4* 10.4*  HCT 39.8 44.0 38.6 32.8* 33.3* 30.2*  MCV 82.1  --  81.6 81.4 80.6 79.7  PLT 294  --  274 271  285 302   Cardiac Enzymes:    Recent Labs Lab 11/28/14 0608  CKTOTAL 43   BNP (last 3 results) No results for input(s): BNP in the last 8760 hours.  ProBNP (last 3 results) No results for input(s): PROBNP in the last 8760 hours.  CBG: No results for input(s): GLUCAP in the last 168 hours.  No results found for this or any previous visit (from the past 240 hour(s)).   Studies: No results found.  Scheduled Meds: . feeding supplement (GLUCERNA SHAKE)  237 mL Oral TID BM  . feeding supplement (PRO-STAT SUGAR FREE 64)  30 mL Oral BID  . folic acid  1 mg Oral Daily  . heparin  5,000 Units Subcutaneous 3 times per day  . lactulose  20 g Oral TID  . losartan  50 mg Oral Daily  .  metoprolol tartrate  25 mg Oral BID  . multivitamin  5 mL Oral Daily  . pantoprazole  40 mg Oral Daily  . thiamine  100 mg Oral Daily  . vitamin B-12  50 mcg Oral Daily    Continuous Infusions: . 0.9 % NaCl with KCl 20 mEq / L 1,000 mL (11/30/14 1406)     Time spent: >45mins  [name] MD, PhD  [facility] Pager [phone]. If 7PM-7AM, please contact night-coverage at [email or web address], password [facility] 11/30/2014, 4:08 PM  LOS: 4 days

## 2014-11-30 NOTE — Progress Notes (Signed)
     [facility] Progress Note  Subjective:   No complaints. Says she feels "so-so". Says she feels less tired today, but still says  It is 2015. Thinks it is Saturday. Able to name Obama as president.    Objective:  Vital signs in last 24 hours: Temp:  [98.6 F (37 C)-99.1 F (37.3 C)] 98.9 F (37.2 C) (04/03 0506) Pulse Rate:  [116-117] 116 (04/03 0506) Resp:  [16-18] 17 (04/03 0506) BP: (142-157)/(98-106) 157/106 mmHg (04/03 0506) SpO2:  [99 %-100 %] 99 % (04/03 0506) Last BM Date: 11/28/14 General:     Well-developed, in NAD Heart:  Regular rate and rhythm; no murmurs Pulm;lungs clear Abdomen:  Soft, nontender and nondistended. Normal bowel sounds, without guarding, and without rebound.   Extremities:  Without edema. Neurologic: Alert , not oriented to time/date.  Lab Results:  Recent Labs  11/28/14 0608 11/29/14 0543  WBC 8.3 8.2  HGB 11.4* 10.4*  HCT 33.3* 30.2*  PLT 285 302   BMET  Recent Labs  11/28/14 0608 11/29/14 0543  NA 139 141  K 3.2* 3.5  CL 104 111  CO2 30 25  GLUCOSE 88 95  BUN <5* <5*  CREATININE 0.67 0.61  CALCIUM 8.8 8.6   LFT  Recent Labs  11/29/14 0543  PROT 5.7*  ALBUMIN 2.6*  AST 88*  ALT 238*  ALKPHOS 193*  BILITOT 0.8     ASSESSMENT/PLAN:   39 yo female admitted with abnl LFTs--etiology unclear.ASMA nl.Mitochondrial antibodies pending. Ceruloplasmin nl. Alpha 1 antitrypsin pending. Ammonia 31.Hepatitis panel neg.Pt  With mild confusion, MRI brain with no acute findings. Await remaining labs as to abnl LFT etiology. Psych to see pt to help sort out confusion.  If LFTs remain abnl, may need liver bx.     LOS: 4 days   Hvozdovic, Tollie PizzaLori P PA-C 11/30/2014, Pager [phone]  [facility] Attending  I have also seen and assessed the patient and agree with the advanced practitioner's assessment and plan. Instead of neuro getting psych consult. Will see what they say Am doubting a primary GI process and suspect  fatty liver. Depending upon situation liver bx could be needed.  Iva Booparl E. Gessner, MD, Antionette FairyFACG [facility] [phone] (pager) 11/30/2014 1:23 PM

## 2014-12-01 ENCOUNTER — Encounter (HOSPITAL_COMMUNITY): Payer: Self-pay | Admitting: Physician Assistant

## 2014-12-01 LAB — CBC
HCT: 29 % — ABNORMAL LOW (ref 36.0–46.0)
HCT: 31.5 % — ABNORMAL LOW (ref 36.0–46.0)
HEMOGLOBIN: 10.6 g/dL — AB (ref 12.0–15.0)
Hemoglobin: 9.9 g/dL — ABNORMAL LOW (ref 12.0–15.0)
MCH: 27.7 pg (ref 26.0–34.0)
MCH: 27.1 pg (ref 26.0–34.0)
MCHC: 34.1 g/dL (ref 30.0–36.0)
MCHC: 33.7 g/dL (ref 30.0–36.0)
MCV: 81.2 fL (ref 78.0–100.0)
MCV: 80.6 fL (ref 78.0–100.0)
PLATELETS: 290 10*3/uL (ref 150–400)
PLATELETS: 343 10*3/uL (ref 150–400)
RBC: 3.57 MIL/uL — ABNORMAL LOW (ref 3.87–5.11)
RBC: 3.91 MIL/uL (ref 3.87–5.11)
RDW: 18.9 % — AB (ref 11.5–15.5)
RDW: 19.1 % — ABNORMAL HIGH (ref 11.5–15.5)
WBC: 6.3 10*3/uL (ref 4.0–10.5)
WBC: 6.5 10*3/uL (ref 4.0–10.5)

## 2014-12-01 LAB — RETICULOCYTES
RBC.: 3.57 MIL/uL — ABNORMAL LOW (ref 3.87–5.11)
RETIC COUNT ABSOLUTE: 146.4 10*3/uL (ref 19.0–186.0)
Retic Ct Pct: 4.1 % — ABNORMAL HIGH (ref 0.4–3.1)

## 2014-12-01 LAB — COMPREHENSIVE METABOLIC PANEL
ALK PHOS: 200 U/L — AB (ref 39–117)
ALT: 214 U/L — AB (ref 0–35)
AST: 141 U/L — ABNORMAL HIGH (ref 0–37)
Albumin: 2.6 g/dL — ABNORMAL LOW (ref 3.5–5.2)
Anion gap: 8 (ref 5–15)
BUN: 5 mg/dL — ABNORMAL LOW (ref 6–23)
CO2: 23 mmol/L (ref 19–32)
Calcium: 8.6 mg/dL (ref 8.4–10.5)
Chloride: 108 mmol/L (ref 96–112)
Creatinine, Ser: 0.61 mg/dL (ref 0.50–1.10)
GFR calc Af Amer: >90 mL/min (ref >90)
Glucose, Bld: 99 mg/dL (ref 70–99)
POTASSIUM: 3.7 mmol/L (ref 3.5–5.1)
Sodium: 139 mmol/L (ref 135–145)
Total Bilirubin: 0.7 mg/dL (ref 0.3–1.2)
Total Protein: 5.6 g/dL — ABNORMAL LOW (ref 6.0–8.3)

## 2014-12-01 LAB — FOLATE RBC
Folate, Hemolysate: 308.4 ng/mL
Folate, RBC: 820 ng/mL (ref >498)
Hematocrit: 37.6 % (ref 34.0–46.6)

## 2014-12-01 LAB — MAGNESIUM: Magnesium: 1.8 mg/dL (ref 1.5–2.5)

## 2014-12-01 MED ORDER — BISACODYL 5 MG PO TBEC: 10.0000 mg | DELAYED_RELEASE_TABLET | Freq: Every day | ORAL | Status: DC | PRN

## 2014-12-01 NOTE — Progress Notes (Signed)
Physical Therapy Treatment Patient Details Name: [name] FantasiaChristel Whelchel MRN: [id number] DOB: 09/04/1975 Today's Date: 12/01/2014    History of Present Illness Pt is a 39 y.o. Female PMH of HTN, obesity, and s/p gastric bypass in 07/2014 admitted 11/26/14 for nausea, elevated LFT, weakness and ataxia.     PT Comments    Progressing slowly, but notably better than on evaluation.  Pt still looks a bit down and depressed of affect.  She relates tingle in legs and feetl  That would account for some of the ataxia noted in the gait.  Follow Up Recommendations  SNF     Equipment Recommendations  Rolling walker with 5" wheels    Recommendations for Other Services       Precautions / Restrictions Precautions Precautions: Fall    Mobility  Bed Mobility Overal bed mobility: Needs Assistance Bed Mobility: Supine to Sit     Supine to sit: Min assist Sit to supine: Min assist   General bed mobility comments: needed mod help to bridge to EOB, but notably better than on evaluation  Transfers Overall transfer level: Needs assistance Equipment used: 2 person hand held assist Transfers: Sit to/from UGI CorporationStand;Stand Pivot Transfers Sit to Stand: Mod assist;+2 safety/equipment Stand pivot transfers: Mod assist;+2 safety/equipment       General transfer comment: assist to come forwand and power up.  Ambulation/Gait Ambulation/Gait assistance: Mod assist;+2 safety/equipment Ambulation Distance (Feet): 8 Feet (x2 including pivots to back up.) Assistive device: Rolling walker (2 wheeled) Gait Pattern/deviations: Step-to pattern;Decreased step length - right;Decreased step length - left;Decreased stride length Gait velocity: slow   General Gait Details: "tin soldier" gait with minimal knee flexion.  Unable to bend knee significant even when cued   Stairs            Wheelchair Mobility    Modified Rankin (Stroke Patients Only)       Balance Overall balance assessment: Needs  assistance Sitting-balance support: No upper extremity supported Sitting balance-Leahy Scale: Fair       Standing balance-Leahy Scale: Poor Standing balance comment: Stood in the RW practicing w/shift and unweighting, marching inplace. improving  knee/hip flecion with toe off.                    Cognition Arousal/Alertness: Awake/alert Behavior During Therapy: WFL for tasks assessed/performed (depressed affect) Overall Cognitive Status: Within Functional Limits for tasks assessed                      Exercises      General Comments        Pertinent Vitals/Pain Pain Assessment: No/denies pain    Home Living                      Prior Function            PT Goals (current goals can now be found in the care plan section) Acute Rehab PT Goals PT Goal Formulation: With patient Time For Goal Achievement: 12/11/14 Potential to Achieve Goals: Good Progress towards PT goals: Progressing toward goals    Frequency  Min 3X/week    PT Plan Current plan remains appropriate    Co-evaluation             End of Session   Activity Tolerance: Patient tolerated treatment well Patient left: in bed;with call bell/phone within reach     Time: [id number] PT Time Calculation (min) (ACUTE ONLY): 26 min  Charges:  $Gait Training:  8-22 mins                    G Codes:      Nohely Whitehorn, Eliseo Gum 12/01/2014, 12:17 PM 12/01/2014  Virgil Bing, PT [phone]  (pager)

## 2014-12-01 NOTE — Progress Notes (Signed)
PROGRESS NOTE  [name] [name] ZOX:[id number] DOB: September 26, 1975 DOA: 11/26/2014 PCP: No primary care provider on file.  HPI/Recap of past 24 hours:  Reported feeling the same, still mild confusion, no active n/v, denies pain,    Assessment/Plan: Principal Problem:   Adjustment disorder with mixed anxiety and depressed mood Active Problems:   Ataxia   Elevated LFTs   Obesity   Anxiety   Cholelithiasis   FTT (failure to thrive) in adult   Confusion   Essential hypertension   Sinus tachycardia   Elevation of LFT: unclear etiology, work up in progress. GI consulted.  Hypokalemia/hypomagnesemia: replace k, replace mag. Continue gentle hydration.  Confusion: MRI brain no acute finding, ammonia level wnl. tsh/b12 wnl, folate level pending, thiamine level pending. Start b12/folic acid/thamine supplement on 4/3. Confabulation? Anxiety? Psych started antidepressant.  HTN: continue cozaar, hold hctz. increase lopressor due to persistent sinus tachycardia. TSH wnl. Hr better on lopressor, continue titrate  FTT/progressive weakness: mri brain/spine no acute findings. PT/OT/SNF  Obesity s/p gastric bypass. Reported 80pounds of weight loss since bypass procedure in 06/2014. Dietician consult.    Code Status: full  Family Communication: patient   Disposition Plan: snf   Consultants:  GI psych Procedures:  HIDA/ab us/mri brain/spine  Antibiotics:  none   Objective: BP 143/96 mmHg  Pulse 98  Temp(Src) 98.2 F (36.8 C) (Oral)  Resp 18  Ht  (1.727 m)  Wt 74.208 kg (163 lb 9.6 oz)  BMI 24.88 kg/m2  SpO2 99%  LMP  (LMP Unknown)  Intake/Output Summary (Last 24 hours) at 12/01/14 1956 Last data filed at 12/01/14 1835  Gross per 24 hour  Intake    500 ml  Output   1650 ml  Net  -1150 ml   Filed Weights   11/26/14 1755  Weight: 74.208 kg (163 lb 9.6 oz)    Exam:   General:  NAD, weak  Cardiovascular: RRR  Respiratory: CTABL  Abdomen: Soft/ND/NT,  positive BS  Musculoskeletal: No Edema  Neuro: not oriented to time or place today. Poor short term memory. Generalized weakness, slight improvement in strength , able to life legs against gravity slightly, No focal deficit.  Data Reviewed: Basic Metabolic Panel:  Recent Labs Lab 11/27/14 0656 11/28/14 0608 11/29/14 0543 11/30/14 1055 12/01/14 0628  NA 144 139 141 137 139  K 2.9* 3.2* 3.5 3.6 3.7  CL 108 104 111 105 108  CO2 GLUCOSE 97 88 95 108* 99  BUN 8 <5* <5* 5* 5*  CREATININE 0.77 0.67 0.61 0.79 0.61  CALCIUM 8.8 8.8 8.6 8.8 8.6  MG  --   --  1.6 2.1 1.8   Liver Function Tests:  Recent Labs Lab 11/27/14 0656 11/28/14 0608 11/29/14 0543 11/30/14 1055 12/01/14 0628  AST 112* 89* 88* 84* 141*  ALT 349* 284* 238* 216* 214*  ALKPHOS 185* 196* 193* 193* 200*  BILITOT 1.2 1.0 0.8 0.8 0.7  PROT 6.3 6.5 5.7* 6.5 5.6*  ALBUMIN 2.9* 2.9* 2.6* 2.9* 2.6*    Recent Labs Lab 11/26/14 1100  LIPASE 48    Recent Labs Lab 11/27/14 1500  AMMONIA 31   CBC:  Recent Labs Lab 11/26/14 1100 11/27/14 0656 11/27/14 1829 11/28/14 0608 11/29/14 0543 12/01/14 0628 12/01/14 0906  WBC 6.9 7.5  --  8.3 8.2 6.3 6.5  NEUTROABS 4.2  --   --   --   --   --   --   HGB 13.1 11.3*  --  11.4* 10.4* 9.9* 10.6*  HCT 38.6 32.8* 37.6 33.3* 30.2* 29.0* 31.5*  MCV 81.6 81.4  --  80.6 79.7 81.2 80.6  PLT 274 271  --  285 302 290 343   Cardiac Enzymes:    Recent Labs Lab 11/28/14 0608  CKTOTAL 43   BNP (last 3 results) No results for input(s): BNP in the last 8760 hours.  ProBNP (last 3 results) No results for input(s): PROBNP in the last 8760 hours.  CBG: No results for input(s): GLUCAP in the last 168 hours.  No results found for this or any previous visit (from the past 240 hour(s)).   Studies: No results found.  Scheduled Meds: . feeding supplement (GLUCERNA SHAKE)  237 mL Oral TID BM  . feeding supplement (PRO-STAT SUGAR FREE 64)  30 mL Oral  BID  . folic acid  1 mg Oral Daily  . heparin  5,000 Units Subcutaneous 3 times per day  . lactulose  20 g Oral TID  . losartan  50 mg Oral Daily  . metoprolol tartrate  25 mg Oral BID  . multivitamin  5 mL Oral Daily  . pantoprazole  40 mg Oral Daily  . sertraline  50 mg Oral Daily  . thiamine  100 mg Oral Daily  . vitamin B-12  50 mcg Oral Daily  . zolpidem  5 mg Oral QHS    Continuous Infusions: . 0.9 % NaCl with KCl 20 mEq / L 75 mL/hr at 12/01/14 1603     Time spent: >3735mins  Majed Pellegrin MD, PhD  [facility] Pager [phone]. If 7PM-7AM, please contact night-coverage at [email or web address], password [email or web address] 12/01/2014, 7:56 PM  LOS: 5 days

## 2014-12-01 NOTE — Progress Notes (Signed)
Daily Rounding Note  12/01/2014, 8:45 AM  LOS: 5 days   SUBJECTIVE:       Can not recall if she has had a BM, or when she last had BM.  Nothing recorded but her dad says he left pt's room last night while she had BM.   OBJECTIVE:         Vital signs in last 24 hours:    Temp:  [98.5 F (36.9 C)-98.6 F (37 C)] 98.5 F (36.9 C) (04/04 0509) Pulse Rate:  [108-124] 108 (04/04 0509) Resp:  [18] 18 (04/04 0509) BP: (149-158)/(96-107) 158/107 mmHg (04/04 0509) SpO2:  [99 %] 99 % (04/04 0509) Last BM Date: 11/28/14 Filed Weights   11/26/14 1755  Weight: 163 lb 9.6 oz (74.208 kg)   General: looks well.  Not toxic or ill appearing   Heart: RRR Chest: clear.  No cough or dyspnea Abdomen: soft, ND, NT.  Active BS  Extremities: no CCE.   Neuro/Psych:  Still saying she is at [place] and that year is 2015, despite being oriented to [facility] and 2016 for past several days.  Can not recall that she had BM last night (father says she did, however not documented on flow sheets).  No tremor and moves all 4 limbs.  Strength not tested.    Intake/Output from previous day: 04/03 0701 - 04/04 0700 In: 5142.5 [I.V.:5142.5] Out: 1900 [Urine:1900]  Intake/Output this shift:    Lab Results:  Recent Labs  11/29/14 0543 12/01/14 0628  WBC 8.2 6.3  HGB 10.4* 9.9*  HCT 30.2* 29.0*  PLT 302 290   BMET  Recent Labs  11/29/14 0543 11/30/14 1055 12/01/14 0628  NA 141 137 139  K 3.5 3.6 3.7  CL 111 105 108  CO2 _0 GLUCOSE 95 108* 99  BUN <5* 5* 5*  CREATININE 0.61 0.79 0.61  CALCIUM 8.6 8.8 8.6   LFT  Recent Labs  11/29/14 0543 11/30/14 1055 12/01/14 0628  PROT 5.7* 6.5 5.6*  ALBUMIN 2.6* 2.9* 2.6*  AST 88* 84* 141*  ALT 238* 216* 214*  ALKPHOS 193* 193* 200*  BILITOT 0.8 0.8 0.7   Scheduled Meds: . feeding supplement (GLUCERNA SHAKE)  237 mL Oral TID BM  . feeding supplement (PRO-STAT SUGAR FREE 64)  30  mL Oral BID  . folic acid  1 mg Oral Daily  . heparin  5,000 Units Subcutaneous 3 times per day  . lactulose  20 g Oral TID  . losartan  50 mg Oral Daily  . metoprolol tartrate  25 mg Oral BID  . multivitamin  5 mL Oral Daily  . pantoprazole  40 mg Oral Daily  . sertraline  50 mg Oral Daily  . thiamine  100 mg Oral Daily  . vitamin B-12  50 mcg Oral Daily  . zolpidem  5 mg Oral QHS   Continuous Infusions: . 0.9 % NaCl with KCl 20 mEq / L 1,000 mL (11/30/14 1406)   PRN Meds:.hydrALAZINE, ondansetron **OR** ondansetron (ZOFRAN) IV   ASSESMENT:    * Abnormal LFTs (alk phos, AST/ALT).    ? Drug reaction (DILI).  ? Underlying liver disease? Ultrasound with GB sludge, HIDA with decreased GB EF last week.  CT scans x 2 in Feb 2016 (at [facility]) with solid liver lesions of undefined etiology.  Smooth muscle Ab, Mitochondrial Ab, Ceruloplasmin all negative.  A1 antitrypsin level pending (turnaround time is 9 days).  LFTs overall improved but fluctuating.   * Hx morbid obesity.  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 Weight drop of 80 plus  #.  Preop weight 244#.   *  Prolonged post op n/v.  EGD within last few months showed gatritis, erosion at bypass surgery site. On daily po Protonix.  Generally poor po intake.  Note she is getting Glucerna tid, and prostat liquid bid, folic acid, .   * Confusion, persists. Negative head CT in 09/2014, negative MRI 11/23/14.  Ammonia level is normal.  Adjustment d/o with mixed depression/anxiety per psych.  Had been started on Doxepin in 09/2014 but was not taking this at time of admission. Now initiated on day 2 Sertraline and Ambien for insomnia.    * Weakness, ataxia.  Accepted for SNF rehab at several facilities. PT/OT working with pt.   * Constipation. Lactulose tid added 3/31.  Does not appear to be having much in way of regular BMs, though hard to assess as unclear if all stools being documented.     PLAN   *  ?  Repeat CT scan of liver? Vs liver biopsy (generalized parenchymal biopsy vs of one of the lesions?).    *  Add daily prn dulcolax.   *  As po intake not being consistently recorded, do we need to get a calorie count.  Will start with RN care order to document % of trays she consumes.   *  Dr Tora Duck at [facility] in [place] is her GI MD.     Azucena Freed  12/01/2014, 8:45 AM Pager: [phone]  Attending MD note:   I have taken a history, examined the patient, and reviewed the chart.I feel biliary cause of N&V very possible in the setting of large amount of biliary sludge, massive weight loss and abnormal LFT's. HIDA scan confirms suboptimal biliary function.in absence of pr taking narcotics.She can have a liver Bx during cholecystectomy if it comes to it.  [facility] Pager # [phone]

## 2014-12-01 NOTE — Progress Notes (Addendum)
Occupational Therapy Treatment Patient Details Name: [name] [name] [name] MRN: [id number] DOB: 03/07/1976 Today's Date: 12/01/2014    History of present illness Pt is a 39 y.o. Female PMH of HTN, obesity, and s/p gastric bypass in 07/2014 admitted 11/26/14 for nausea, elevated LFT, weakness and ataxia.    OT comments  Pt progressing. Education provided in session. Continue to recommend SNF for rehab.  Follow Up Recommendations  SNF;Supervision/Assistance - 24 hour    Equipment Recommendations  Other (comment) (tbd)    Recommendations for Other Services      Precautions / Restrictions Precautions Precautions: Fall Restrictions Weight Bearing Restrictions: No       Mobility Bed Mobility  General bed mobility comments: not assessed  Transfers Overall transfer level: Needs assistance Equipment used: Rolling walker (2 wheeled) Transfers: Sit to/from U[employer] CorporationStand;Stand Pivot Transfers Sit to Stand: Mod assist;+2 physical assistance Stand pivot transfers: Min guard       General transfer comment: cues for technique/hand placement.     Balance  Min guard for stand pivot to chair with RW.             ADL Overall ADL's : Needs assistance/impaired                     Lower Body Dressing: Minimal assistance; using adaptive equipment;Sitting/lateral leans (donned/doffed socks and doffed underwear)   Toilet Transfer: Moderate assistance;Min guard;Ambulation;Stand-pivot;RW;BSC (+2 Mod A for sit to stand from [facility] Inc[facility]SC; Min guard to pivot)   Toileting- Clothing Manipulation and Hygiene: Total assistance;Sit to/from stand       Functional mobility during ADLs: Min guard;Rolling walker (stand pivot) General ADL Comments: Educated on AE and LB dressing technique. Pt practiced with sockaid.  Pt able to doff both socks and able to don socks with sockaid. OT assisted in getting underwear off of hips/buttocks and pt assisted in pushing off legs/feet. Pt on BSC when OT arrived-assisted with  hygiene and pulling up panties.      Vision                     Perception     Praxis      Cognition  Awake/Alert Behavior During Therapy: WFL for tasks assessed/performed Overall Cognitive Status: Within Functional Limits for tasks assessed                       Extremity/Trunk Assessment               Exercises     Shoulder Instructions       General Comments      Pertinent Vitals/ Pain       Pain Assessment: 0-10 Pain Score: 7  Pain Location: bilateral LEs Pain Descriptors / Indicators: Sore Pain Intervention(s): Monitored during session;Repositioned  Home Living                                          Prior Functioning/Environment              Frequency Min 2X/week     Progress Toward Goals  OT Goals(current goals can now be found in the care plan section)  Progress towards OT goals: Progressing toward goals  Acute Rehab OT Goals Patient Stated Goal: get back to caring for house, kids, work OT Goal Formulation: With patient Time For Goal Achievement: 12/11/14 Potential to Achieve Goals: Good  ADL Goals Pt Will Perform Lower Body Bathing: with min guard assist;sit to/from stand Pt Will Perform Lower Body Dressing: with min guard assist;sit to/from stand Pt Will Transfer to Toilet: with min guard assist;ambulating Pt Will Perform Toileting - Clothing Manipulation and hygiene: with min guard assist;sit to/from stand  Plan Discharge plan remains appropriate    Co-evaluation                 End of Session Equipment Utilized During Treatment: Gait belt;Rolling walker   Activity Tolerance Patient limited by pain   Patient Left in chair;with call bell/phone within reach;with family/visitor present   Nurse Communication          Time: 1610-9604 OT Time Calculation (min): 19 min  Charges: OT General Charges $OT Visit: 1 Procedure OT Treatments $Self Care/Home Management : 8-22 mins  Earlie Raveling OTR/L [phone] 12/01/2014, 3:38 PM

## 2014-12-01 NOTE — Clinical Social Work Note (Signed)
Patient's husband brought in [employer] policy information. Patient has [employer]BS insurance, Contact Number: [id number], Group Number: [id number]. [facility] Facility has started [place].    Roddie McBryant Ayman Brull MSW, [place]ittenango[place]CSWA, [place]ASA, [phone]

## 2014-12-02 LAB — COMPREHENSIVE METABOLIC PANEL
ALT: 244 U/L — ABNORMAL HIGH (ref 0–35)
AST: 163 U/L — AB (ref 0–37)
Albumin: 2.7 g/dL — ABNORMAL LOW (ref 3.5–5.2)
Alkaline Phosphatase: 224 U/L — ABNORMAL HIGH (ref 39–117)
Anion gap: 5 (ref 5–15)
BUN: 8 mg/dL (ref 6–23)
CALCIUM: 8.8 mg/dL (ref 8.4–10.5)
CO2: 26 mmol/L (ref 19–32)
CREATININE: 0.67 mg/dL (ref 0.50–1.10)
Chloride: 106 mmol/L (ref 96–112)
GFR calc Af Amer: >90 mL/min (ref >90)
Glucose, Bld: 90 mg/dL (ref 70–99)
Potassium: 3.8 mmol/L (ref 3.5–5.1)
SODIUM: 137 mmol/L (ref 135–145)
TOTAL PROTEIN: 5.6 g/dL — AB (ref 6.0–8.3)
Total Bilirubin: 0.9 mg/dL (ref 0.3–1.2)

## 2014-12-02 LAB — RPR: RPR Ser Ql: NONREACTIVE

## 2014-12-02 LAB — ALPHA-1 ANTITRYPSIN PHENOTYPE: A-1 Antitrypsin: 166 mg/dL (ref 83–199)

## 2014-12-02 LAB — HIV ANTIBODY (ROUTINE TESTING W REFLEX): HIV SCREEN 4TH GENERATION: NONREACTIVE

## 2014-12-02 LAB — MAGNESIUM: MAGNESIUM: 1.9 mg/dL (ref 1.5–2.5)

## 2014-12-02 NOTE — Progress Notes (Signed)
PROGRESS NOTE  [name] [name] XBM:[id number] DOB: May 16, 1976 DOA: 11/26/2014 PCP: No primary care provider on file.  HPI/Recap of past 24 hours:  Reported feeling the same, still mild confusion, no active n/v, denies pain,    Assessment/Plan: Principal Problem:   Adjustment disorder with mixed anxiety and depressed mood Active Problems:   Ataxia   Elevated LFTs   Obesity   Anxiety   Cholelithiasis   FTT (failure to thrive) in adult   Confusion   Essential hypertension   Sinus tachycardia   Elevation of LFT: unclear etiology, work up in progress. GI consulted. GI consulted surgery. Follow rec.  Hypokalemia/hypomagnesemia: replace k, replace mag. Continue gentle hydration.  Confusion:  MRI brain no acute finding, ammonia level wnl. Tsh/b12/folate wnl, rpr/hiv neg, thiamine level pending. Start b12/folic acid/thamine supplement on 4/3. Confabulation? Anxiety? Psych started antidepressant.  HTN: continue cozaar, hold hctz. increase lopressor due to persistent sinus tachycardia. TSH wnl. Hr better on lopressor, continue titrate  FTT/progressive weakness: acetycholine receptor antibody pending. Mri brain/spine no acute findings. PT/OT/SNF  Obesity s/p gastric bypass. Reported 80pounds of weight loss since bypass procedure in 06/2014. Dietician consult. Limited oral intake has been on gentle hydration since admission. Consider stop hydration if po intake better.    Code Status: full  Family Communication: patient   Disposition Plan: snf   Consultants:  GI Psych surgery Procedures:  HIDA/ab us/mri brain/spine  Antibiotics:  none   Objective: BP 160/107 mmHg  Pulse 92  Temp(Src) 98.4 F (36.9 C) (Oral)  Resp 18  Ht  (1.727 m)  Wt 74.208 kg (163 lb 9.6 oz)  BMI 24.88 kg/m2  SpO2 100%  LMP  (LMP Unknown)  Intake/Output Summary (Last 24 hours) at 12/02/14 2316 Last data filed at 12/02/14 1524  Gross per 24 hour  Intake    240 ml  Output   2975 ml    Net  -2735 ml   Filed Weights   11/26/14 1755  Weight: 74.208 kg (163 lb 9.6 oz)    Exam:   General:  NAD, weak  Cardiovascular: RRR  Respiratory: CTABL  Abdomen: Soft/ND/NT, positive BS  Musculoskeletal: No Edema  Neuro: not oriented to time or place today. Poor short term memory. Generalized weakness, slight improvement in strength , able to life legs against gravity slightly, No focal deficit.  Data Reviewed: Basic Metabolic Panel:  Recent Labs Lab 11/28/14 0608 11/29/14 0543 11/30/14 1055 12/01/14 0628 12/01/14 2359 12/02/14 0014  NA 139 141 137 139 137  --   K 3.2* 3.5 3.6 3.7 3.8  --   CL 104 111 105 108 106  --   CO2 --   GLUCOSE 88 95 108* 99 90  --   BUN <5* <5* 5* 5* 8  --   CREATININE 0.67 0.61 0.79 0.61 0.67  --   CALCIUM 8.8 8.6 8.8 8.6 8.8  --   MG  --  1.6 2.1 1.8  --  1.9   Liver Function Tests:  Recent Labs Lab 11/28/14 0608 11/29/14 0543 11/30/14 1055 12/01/14 0628 12/01/14 2359  AST 89* 88* 84* 141* 163*  ALT 284* 238* 216* 214* 244*  ALKPHOS 196* 193* 193* 200* 224*  BILITOT 1.0 0.8 0.8 0.7 0.9  PROT 6.5 5.7* 6.5 5.6* 5.6*  ALBUMIN 2.9* 2.6* 2.9* 2.6* 2.7*    Recent Labs Lab 11/26/14 1100  LIPASE 48    Recent Labs Lab 11/27/14 1500  AMMONIA 31  CBC:  Recent Labs Lab 11/26/14 1100 11/27/14 0656 11/27/14 1829 11/28/14 [id number] 11/29/14 0543 12/01/14 0628 12/01/14 0906  WBC 6.9 7.5  --  8.3 8.2 6.3 6.5  NEUTROABS 4.2  --   --   --   --   --   --   HGB 13.1 11.3*  --  11.4* 10.4* 9.9* 10.6*  HCT 38.6 32.8* 37.6 33.3* 30.2* 29.0* 31.5*  MCV 81.6 81.4  --  80.6 79.7 81.2 80.6  PLT 274 271  --  285 302 290 343   Cardiac Enzymes:    Recent Labs Lab 11/28/14 0608  CKTOTAL 43   BNP (last 3 results) No results for input(s): BNP in the last 8760 hours.  ProBNP (last 3 results) No results for input(s): PROBNP in the last 8760 hours.  CBG: No results for input(s): GLUCAP in the last 168  hours.  No results found for this or any previous visit (from the past 240 hour(s)).   Studies: No results found.  Scheduled Meds: . feeding supplement (GLUCERNA SHAKE)  237 mL Oral TID BM  . feeding supplement (PRO-STAT SUGAR FREE 64)  30 mL Oral BID  . folic acid  1 mg Oral Daily  . heparin  5,000 Units Subcutaneous 3 times per day  . lactulose  20 g Oral TID  . losartan  50 mg Oral Daily  . metoprolol tartrate  25 mg Oral BID  . multivitamin  5 mL Oral Daily  . pantoprazole  40 mg Oral Daily  . sertraline  50 mg Oral Daily  . thiamine  100 mg Oral Daily  . vitamin B-12  50 mcg Oral Daily  . zolpidem  5 mg Oral QHS    Continuous Infusions: . 0.9 % NaCl with KCl 20 mEq / L 75 mL/hr at 12/02/14 [id number]     Time spent: >3335mins  Viriginia Amendola MD, PhD  [facility] Pager [phone]. If 7PM-7AM, please contact night-coverage at [email or web address], password [email or web address] 12/02/2014, 11:16 PM  LOS: 6 days

## 2014-12-02 NOTE — Clinical Social Work Note (Addendum)
[facility] Facility has received [employer] auth and is ready to accept patient if medically stable for DC. Husband requested [facility], but CSW has informed him that this is not an option. [facility].   Roddie McBryant Penney Domanski MSW, [place]villeLCSWA, [place]LCASA, [phone]

## 2014-12-02 NOTE — Progress Notes (Signed)
Daily Rounding Note  12/02/2014, 8:15 AM  LOS: 6 days   SUBJECTIVE:       Consumed 50% of last nights dinner.  1 BM recorded. Pt does not recall the BM, husband confirmed this.  No N/V.  No abd pain.   OBJECTIVE:         Vital signs in last 24 hours:    Temp:  [98.2 F (36.8 C)-98.7 F (37.1 C)] 98.2 F (36.8 C) (04/05 0544) Pulse Rate:  [95-98] 96 (04/05 0630) Resp:  [16-18] 16 (04/05 0544) BP: (134-169)/(90-114) 134/90 mmHg (04/05 0740) SpO2:  [97 %-99 %] 97 % (04/05 0544) Last BM Date: 12/01/14 Filed Weights   11/26/14 1755  Weight: 163 lb 9.6 oz (74.208 kg)   General: looks well, comfortable, something of a blank stare on her face   Heart: RRR Chest: clear bil. Abdomen: soft, NT, ND  Extremities: no CCE Neuro/Psych:  Oriented now to [facility] hospital, still says 2015.  No limb weakness,  Alert and follows all commands.   Intake/Output from previous day: 04/04 0701 - 04/05 0700 In: 620 [P.O.:620] Out: 2775 [Urine:2775]  Intake/Output this shift:    Lab Results:  Recent Labs  12/01/14 0628 12/01/14 0906  WBC 6.3 6.5  HGB 9.9* 10.6*  HCT 29.0* 31.5*  PLT 290 343   BMET  Recent Labs  11/30/14 1055 12/01/14 0628 12/01/14 2359  NA 137 139 137  K 3.6 3.7 3.8  CL 105 108 106  CO2 _0 GLUCOSE 108* 99 90  BUN 5* 5* 8  CREATININE 0.79 0.61 0.67  CALCIUM 8.8 8.6 8.8   LFT  Recent Labs  11/30/14 1055 12/01/14 0628 12/01/14 2359  PROT 6.5 5.6* 5.6*  ALBUMIN 2.9* 2.6* 2.7*  AST 84* 141* 163*  ALT 216* 214* 244*  ALKPHOS 193* 200* 224*  BILITOT 0.8 0.7 0.9     ASSESMENT:   * Abnormal LFTs (alk phos, AST/ALT).  ? Underlying occult  liver disease? Ultrasound with GB sludge, HIDA with decreased GB EF last week. CT scans x 2 in Feb 2016 (at [facility]) with solid liver lesions of undefined etiology.  Smooth muscle Ab, Mitochondrial Ab, Ceruloplasmin all negative. A1 antitrypsin level  pending (turnaround time is 9 days).  LFTs trending back up after having declined overall c/w admission.  GI MD is Dr Salvatore Decent in [place] phone is [phone].   * Hx morbid obesity.  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 Weight drop of 80 plus #. Preop weight 244#.   * Prolonged post op n/v. EGD within last few months showed gatritis, erosion at bypass surgery site. On daily po Protonix.  Generally poor po intake. Note she is getting Glucerna tid, and prostat liquid bid, folic acid, .  Appetite improving.   * Confusion, persists. Negative head CT in 09/2014, negative MRI 11/23/14. Ammonia level is normal. Adjustment d/o with mixed depression/anxiety per psych. Had been started on Doxepin in 09/2014 but was not taking this at time of admission. Now initiated on day 2 Sertraline and Ambien for insomnia.   * Weakness, ataxia. Accepted for SNF rehab at several facilities. PT/OT working with pt.   * Constipation. Lactulose tid added 3/31. Does not appear to be having much in way of regular BMs, though hard to assess as unclear if all stools being documented.     PLAN   *  Will ask gen surgery  for consult re GB? And ? If her n/v and now anorexia might be biliary in origin.  If she were to undergo lap chole, then could have liver bx at the same time.     [name] [name]  12/02/2014, 8:15 AM   Attending MD note:   I have taken a history, , and reviewed the chart. I agree with the Advanced Practitioner's impression and recommendations. Surgery  Seen the pt, wants to do other test before considering lap chole. She just had an EGD and I feel she should go back to Dr Margarita Rana and GI to complete that work up.   [facility] Pager # [phone]  Pager: [phone]

## 2014-12-02 NOTE — Progress Notes (Addendum)
[name] Consult Note  [name] [name] [birth year]  [id number].    Requesting MD: Dr. Olevia Perches Chief Complaint/Reason for Consult: Abdominal pain, nausea, dry heaving, elevated LFT's  HPI:  39 y/o AA female morbidly obese pt s/p sleeve gastrectomy in 06/2014 for weight loss. [name] [name] has significant difficulty remembering details of her medical care. [name] [name] has had issues with post op N/V and anorexia, which prompted her to be seen by her surgeon within the last few weeks. Serum H Pylori test + on surgery pathology so this was treated. S/p EGD (date not clear) showing "gastritis, erosion at the bypass surgery site". LFTs mildly elevated. Several solid, indeterminate, liver lesions seen on CTs in 09/2014 and 10/2014.   Admitted to [facility] 2/23-2/27 with hypernatremia, hypokalemia, hypercalcemia, metabollic acidosis, ARF, mild elevation transaminases. AFP and viral hepatitis studies were normal. Nausea described as hard to control, constipation treated with enemas. Required TPN.  At discharge was started on Doxepin, Reglan, prn Zofran, prn Miralax, prn Senokot. No clear cause for n/v found after multiple studies. Omeprazole, Carafate continued.  Started on course of Diflucan and Nystatin for oral thrush 3/15. Referred to a psychologist after surgical follow up on 3/16.  [name] [name] is noted to have adjustment disorder with anxiety and depressed mood  Seen 3/26 at [facility] ED for weakness anorexia and dizziness making it difficult to walk. No abdominal pain, no dyspnea. Treated for dehydration.  [name] [name] subsequently came to [facility] ED 11/26/14 with same sxs and admitted.  ED MD noted ataxia.   MRI lumbar negative acute problem.  MR brain negative.  HIDA shows reduced EF at 16%, US shows sludge with possible tiny stones.  No GB wall thickening or murhpy's sign.  Denies any frequent use of NSAIDS, tylenol, caffeine.  No aggravating foods, no alleviating factors, no radiating pain.   We were asked to  eval for possible gallbladder as the cause of her N/V.  Alk Phos 224 today and AST 163, ALT 244, Bilirubin is normal today at 0.9.  WBC has been normal since 11/29/14.  Hepatitis workup has been negative thus far.  Lipase normal on admission.   ROS: All systems reviewed and otherwise negative except for as above  History reviewed. No pertinent family history.  Past Medical History  Diagnosis Date  . Hypertension   . ARF (acute renal failure) 09/2014    admitted with electrolyte disturbance, ARF to [facility].   Marland Kitchen Positive H. pylori test 06/2014    serum H pylori + and treated.   . 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 Laparoscopic gastric sleeve resection  06/2014    Dr [name] of [name]  . Endometrial ablation w/ novasure    . Tubal ligation  2002  . Esophagogastroduodenoscopy  2016    Dr [name] [name] of [facility].  found gastritis, erosion at bypass surgery site.     Social History:  reports that [name] [name] has never smoked. [name] [name] does not have any smokeless tobacco history on file. [name] [name] reports that [name] [name] does not drink alcohol or use illicit drugs.  Allergies:  Allergies  Allergen Reactions  . Levaquin [Levofloxacin In D5w] Rash  . Penicillins Itching and Rash    Rash on lips/itch    Medications Prior to Admission  Medication Sig Dispense Refill  . losartan-hydrochlorothiazide (HYZAAR) 50-12.5 MG per tablet Take 1 tablet by mouth daily.    . ondansetron (ZOFRAN)  8 MG tablet Take 1 tablet (8 mg total) by mouth every 8 (eight) hours as needed for nausea. (Patient not taking: Reported on 11/26/2014) 10 tablet 0  . potassium chloride SA (K-DUR,KLOR-CON) 20 MEQ tablet Take 1 tablet (20 mEq total) by mouth 2 (two) times daily. (Patient not taking: Reported on 11/26/2014) 6 tablet 0    Blood pressure 120/86, pulse 94, temperature 98.2 F (36.8 C), temperature source Oral, resp. rate 16, height 5'  8" (1.727 m), weight 74.208 kg (163 lb 9.6 oz), SpO2 97 %. Physical Exam: General: pleasant, WD/WN AA female  who is laying in bed in NAD HEENT: head is normocephalic, atraumatic.  Sclera are noninjected.  PERRL.  Ears and nose without any masses or lesions.  Mouth is pink and moist Heart: regular, rate, and rhythm.  No obvious murmurs, gallops, or rubs noted.  Palpable pedal pulses bilaterally Lungs: CTAB, no wheezes, rhonchi, or rales noted.  Respiratory effort nonlabored Abd: soft, ND, minimal tenderness in the RUQ and epigastrium, negative murphys sign, +BS, no masses, hernias, or organomegaly, laparoscopic keloids noted to skin well healed MS: all 4 extremities are symmetrical with no cyanosis, clubbing, or edema. Skin: warm and dry with no masses, lesions, or rashes Psych: A&Ox3, affect is blunted, emotionless Neuro: CM 2-12 intact, extremity CSM intact bilaterally, speech is very slowed, word finding issues, memory of medical details lacking  Results for orders placed or performed during the hospital encounter of 11/26/14 (from the past 48 hour(s))  Comprehensive metabolic panel     Status: Abnormal   Collection Time: 12/01/14  6:28 AM  Result Value Ref Range   Sodium 139 135 - 145 mmol/L   Potassium 3.7 3.5 - 5.1 mmol/L   Chloride 108 96 - 112 mmol/L   CO2 23 19 - 32 mmol/L   Glucose, Bld 99 70 - 99 mg/dL   BUN 5 (L) 6 - 23 mg/dL   Creatinine, Ser 0.61 0.50 - 1.10 mg/dL   Calcium 8.6 8.4 - 10.5 mg/dL   Total Protein 5.6 (L) 6.0 - 8.3 g/dL   Albumin 2.6 (L) 3.5 - 5.2 g/dL   AST 141 (H) 0 - 37 U/L   ALT 214 (H) 0 - 35 U/L   Alkaline Phosphatase 200 (H) 39 - 117 U/L   Total Bilirubin 0.7 0.3 - 1.2 mg/dL   GFR calc non Af Amer >90 >90 mL/min   GFR calc Af Amer >90 >90 mL/min    Comment: (NOTE) The eGFR has been calculated using the CKD EPI equation. This calculation has not been validated in all clinical situations. eGFR's persistently <90 mL/min signify possible Chronic  Kidney Disease.    Anion gap 8 5 - 15  Magnesium     Status: None   Collection Time: 12/01/14  6:28 AM  Result Value Ref Range   Magnesium 1.8 1.5 - 2.5 mg/dL  CBC     Status: Abnormal   Collection Time: 12/01/14  6:28 AM  Result Value Ref Range   WBC 6.3 4.0 - 10.5 K/uL   RBC 3.57 (L) 3.87 - 5.11 MIL/uL   Hemoglobin 9.9 (L) 12.0 - 15.0 g/dL   HCT 29.0 (L) 36.0 - 46.0 %   MCV 81.2 78.0 - 100.0 fL   MCH 27.7 26.0 - 34.0 pg   MCHC 34.1 30.0 - 36.0 g/dL   RDW 18.9 (H) 11.5 - 15.5 %   Platelets 290 150 - 400 K/uL  Reticulocytes     Status: Abnormal  Collection Time: 12/01/14  6:28 AM  Result Value Ref Range   Retic Ct Pct 4.1 (H) 0.4 - 3.1 %   RBC. 3.57 (L) 3.87 - 5.11 MIL/uL   Retic Count, Manual 146.4 19.0 - 186.0 K/uL  CBC     Status: Abnormal   Collection Time: 12/01/14  9:06 AM  Result Value Ref Range   WBC 6.5 4.0 - 10.5 K/uL   RBC 3.91 3.87 - 5.11 MIL/uL   Hemoglobin 10.6 (L) 12.0 - 15.0 g/dL   HCT 31.5 (L) 36.0 - 46.0 %   MCV 80.6 78.0 - 100.0 fL   MCH 27.1 26.0 - 34.0 pg   MCHC 33.7 30.0 - 36.0 g/dL   RDW 19.1 (H) 11.5 - 15.5 %   Platelets 343 150 - 400 K/uL  Comprehensive metabolic panel     Status: Abnormal   Collection Time: 12/01/14 11:59 PM  Result Value Ref Range   Sodium 137 135 - 145 mmol/L   Potassium 3.8 3.5 - 5.1 mmol/L   Chloride 106 96 - 112 mmol/L   CO2 26 19 - 32 mmol/L   Glucose, Bld 90 70 - 99 mg/dL   BUN 8 6 - 23 mg/dL   Creatinine, Ser 0.67 0.50 - 1.10 mg/dL   Calcium 8.8 8.4 - 10.5 mg/dL   Total Protein 5.6 (L) 6.0 - 8.3 g/dL   Albumin 2.7 (L) 3.5 - 5.2 g/dL   AST 163 (H) 0 - 37 U/L   ALT 244 (H) 0 - 35 U/L   Alkaline Phosphatase 224 (H) 39 - 117 U/L   Total Bilirubin 0.9 0.3 - 1.2 mg/dL   GFR calc non Af Amer >90 >90 mL/min   GFR calc Af Amer >90 >90 mL/min    Comment: (NOTE) The eGFR has been calculated using the CKD EPI equation. This calculation has not been validated in all clinical situations. eGFR's persistently <90 mL/min  signify possible Chronic Kidney Disease.    Anion gap 5 5 - 15  Magnesium     Status: None   Collection Time: 12/02/14 12:14 AM  Result Value Ref Range   Magnesium 1.9 1.5 - 2.5 mg/dL   No results found.    Assessment/Plan Transaminitis (ALT/AST) Elevated alk phos Gallbladder sludge with decreased EF -GI has done a workup and feel it is her gallbladder.  I feel this is multifactorial given her recent gastric surgery, ataxia, and her psych issues.  [name] [name] has minimal pain in her RUQ.  More likely that there could be gastritis/ulceration at her surgery site.  [name] [name] likely needs a upper endoscopy to rule out ulcers.  [name] [name]'s had a recent h/o h.pylori. -Will have Dr. Lucia Gaskins see and evaluate -NPO after MN -Hold blood thinners after MN Solid hepatic lesions (CT [facility] Feb 2016) -GI would like liver biopsy Morbid obesity - resolved -BMI now 24.88  S/p sleeve gastrectomy (Dr. Jeneen Rinks Dasher - [name])  -Lost 80lbs Prolonged post-op N/V -EGD in the last few months shows gastritis, erosion at the surgery site -Protonix daily -Poor oral intake due to anorezia Anxiety disorder with mixed anxiety and depressed mood Ataxia/Confusion -Brain/Lumbar spine MRI negative Constipation Disp -Apparently SNF at discharge per notes   Coralie Keens, [facility] 12/02/2014, 12:36 PM Pager: [phone]  Agree with above.  Odd presentation.  It appears that her symptoms are related to her sleeve resection and subsequent chronic vomiting.  Her symptoms seem to be like a Wernicke's encephalopathy (thiamine deficiency).  I agree with  looking for micronutrient deficiency.  Her B12 was normal.  Thiamine is pending (this apparently was sent to [facility]).  [name] [name] is on several vitamin oral replacements.  Would consider IV thiamine while [name] [name] is here.  Though her gall bladder is abnormal and may warrant surgery.  I think that it is low on the list of her current problems and I would not recommend  surgery at this time.  I think her neurologic symptoms ought to improve before considering general anesthesia.  Alphonsa Overall, MD, [facility] Pager: [phone] Office phone:  [phone]

## 2014-12-02 NOTE — Progress Notes (Signed)
Physical Therapy Treatment Patient Details Name: [name] FantasiaChristel [name] MRN: [id number] DOB: 06/07/1976 Today's Date: 12/02/2014    History of Present Illness Pt is a 39 y.o. Female PMH of HTN, obesity, and s/p gastric bypass in 07/2014 admitted 11/26/14 for nausea, elevated LFT, weakness and ataxia.     PT Comments    Slow progress.  Pt still reports profoundly weak, numb and tingly LE's.  Pt's LE's do move ataxically with little control.  Expect improvement will be slow.   Follow Up Recommendations  SNF     Equipment Recommendations  Rolling walker with 5" wheels    Recommendations for Other Services       Precautions / Restrictions Precautions Precautions: Fall    Mobility  Bed Mobility Overal bed mobility: Needs Assistance Bed Mobility: Supine to Sit;Sit to Supine     Supine to sit: Min assist Sit to supine: Min assist   General bed mobility comments: cuing pt for guidance and letting her do all she can.  Transfers Overall transfer level: Needs assistance Equipment used: Rolling walker (2 wheeled) Transfers: Sit to/from [employer] CorporationStand;Stand Pivot Transfers Sit to Stand: Mod assist;+2 physical assistance Stand pivot transfers: Min assist       General transfer comment: cues, demo, guidance and sequencing of stand. Stability and RW assist during pivot.  Ambulation/Gait                 Stairs            Wheelchair Mobility    Modified Rankin (Stroke Patients Only)       Balance Overall balance assessment: Needs assistance Sitting-balance support: No upper extremity supported Sitting balance-Leahy Scale: Fair       Standing balance-Leahy Scale: Poor Standing balance comment: heavy use of the RW.  On return to bed, at EOB, released  but guarded pt while she stood lightly at EOB                    Cognition Arousal/Alertness: Awake/alert Behavior During Therapy: WFL for tasks assessed/performed Overall Cognitive Status: Within Functional Limits  for tasks assessed                      Exercises      General Comments        Pertinent Vitals/Pain Pain Assessment: Faces Faces Pain Scale: Hurts little more Pain Location: bil LE's Pain Descriptors / Indicators: Burning;Numbness;Tingling Pain Intervention(s): Monitored during session    Home Living                      Prior Function            PT Goals (current goals can now be found in the care plan section) Acute Rehab PT Goals Patient Stated Goal: get back to caring for house, kids, work PT Goal Formulation: With patient Time For Goal Achievement: 12/11/14 Potential to Achieve Goals: Good Progress towards PT goals: Progressing toward goals    Frequency  Min 3X/week    PT Plan Current plan remains appropriate    Co-evaluation             End of Session   Activity Tolerance: Patient tolerated treatment well Patient left: in bed;with call bell/phone within reach     Time: 1240-1300 PT Time Calculation (min) (ACUTE ONLY): 20 min  Charges:  $Therapeutic Activity: 8-22 mins  G Codes:      Jermika Olden, Eliseo Gum 12/02/2014, 1:25 PM 12/02/2014  Ivy Bing, PT [phone]  (pager)

## 2014-12-03 ENCOUNTER — Inpatient Hospital Stay (HOSPITAL_COMMUNITY): Payer: BLUE CROSS/BLUE SHIELD

## 2014-12-03 LAB — ACETYLCHOLINE RECEPTOR, BINDING: Acety choline binding ab: <0.03 nmol/L (ref 0.00–0.24)

## 2014-12-03 MED ORDER — THIAMINE HCL 100 MG/ML IJ SOLN
100.0000 mg | Freq: Every day | INTRAMUSCULAR | Status: DC
  Administered 2014-12-03: 100 mg via INTRAVENOUS
  Filled 2014-12-03 (×2): qty 1

## 2014-12-03 MED ORDER — CYANOCOBALAMIN 1000 MCG/ML IJ SOLN: 1000.0000 ug | Freq: Every morning | INTRAMUSCULAR | Status: DC

## 2014-12-03 MED ORDER — CYANOCOBALAMIN 1000 MCG/ML IJ SOLN
1000.0000 ug | Freq: Every morning | INTRAMUSCULAR | Status: AC
  Administered 2014-12-03 – 2014-12-04 (×2): 1000 ug via INTRAMUSCULAR
  Filled 2014-12-03 (×2): qty 1

## 2014-12-03 NOTE — Progress Notes (Signed)
[facility] Progress Note     Subjective: Pt says she feels some better.  Still has mild abdominal pain, nausea, weakness, fatigue, difficulty walking.  No SOB, fever, chills.  Tolerating soft diet, but appetite low.  Working with therapy.    Objective: Vital signs in last 24 hours: Temp:  [98.4 F (36.9 C)-98.6 F (37 C)] 98.5 F (36.9 C) (04/06 0552) Pulse Rate:  [92-113] 113 (04/06 0552) Resp:  [16-18] 18 (04/06 0552) BP: (117-160)/(82-107) 142/94 mmHg (04/06 0552) SpO2:  [99 %-100 %] 99 % (04/06 0552) Last BM Date: 12/01/14  Intake/Output from previous day: 04/05 0701 - 04/06 0700 In: 5317.5 [P.O.:600; I.V.:4717.5] Out: 2100 [Urine:2100] Intake/Output this shift: Total I/O In: 762 [P.O.:237; I.V.:525] Out: -   PE: Gen:  Alert, NAD, pleasant Abd: Soft, ND, mild tenderness in the upper abdomen, +BS, no HSM, laparoscopic scars well healed but keloids Psych:  Alert, but disoriented to specific details of her day and medical care, memory impaired  Lab Results:   Recent Labs  12/01/14 0628 12/01/14 0906  WBC 6.3 6.5  HGB 9.9* 10.6*  HCT 29.0* 31.5*  PLT 290 343   BMET  Recent Labs  12/01/14 0628 12/01/14 2359  NA 139 137  K 3.7 3.8  CL 108 106  CO2 23 26  GLUCOSE 99 90  BUN 5* 8  CREATININE 0.61 0.67  CALCIUM 8.6 8.8   PT/INR No results for input(s): LABPROT, INR in the last 72 hours. CMP     Component Value Date/Time   NA 137 12/01/2014 2359   K 3.8 12/01/2014 2359   CL 106 12/01/2014 2359   CO2 26 12/01/2014 2359   GLUCOSE 90 12/01/2014 2359   BUN 8 12/01/2014 2359   CREATININE 0.67 12/01/2014 2359   CALCIUM 8.8 12/01/2014 2359   PROT 5.6* 12/01/2014 2359   ALBUMIN 2.7* 12/01/2014 2359   AST 163* 12/01/2014 2359   ALT 244* 12/01/2014 2359   ALKPHOS 224* 12/01/2014 2359   BILITOT 0.9 12/01/2014 2359   GFRNONAA >90 12/01/2014 2359   GFRAA >90 12/01/2014 2359   Lipase     Component Value Date/Time   LIPASE 48 11/26/2014  1100       Studies/Results: No results found.  Anti-infectives: Anti-infectives    None       Assessment/Plan Transaminitis (ALT/AST) Elevated alk phos Gallbladder sludge with decreased EF  -GI has done a workup and feel it is her gallbladder. I feel this is multifactorial given her recent gastric surgery, ataxia, and her psych issues. She has minimal pain in her RUQ.   -Needs to see GI as an outpatient and likely needs repeat upper endoscopy  -Dr. Lucia Gaskins recommends checking vitamin levels including thiamine.  May consider IV thiamine while she's here in addition to oral in case her symptoms are from Wernicke's encephalopathy.  Although, her gallbladder is abnormal and may warrant surgery - she can return to [facility] for continuity of care. It is low on the list of her current problems and we would not recommend surgery at this time. Her neurologic symptoms ought to improve before considering general anesthesia. -Soft diet as tolerated Solid hepatic lesions (CT [facility] Feb 2016) -GI would like liver biopsy - IR would be helpful Morbid obesity - resolved -BMI now 24.88  S/p sleeve gastrectomy (Dr. Jeneen Rinks Dasher - [facility] [place])  -Lost [phone] -Follow up with Dr. [name] at discharge Prolonged post-op N/V -EGD in the last few months shows gastritis, erosion at  the surgery site -Protonix daily -Poor oral intake due to anorexia Anxiety disorder with mixed anxiety and depressed mood Ataxia/Confusion/Encephalopathy -Brain/Lumbar spine MRI negative Constipation Disp -Apparently SNF at discharge per notes    LOS: 7 days    DORT, [facility] 12/03/2014, 12:32 PM Pager: [phone]  Agree above. Though a thiamine level was ordered on 11/29/2014 - it appears that the lab failed to get this lab. Unclear what happened.  Because of the thiamine she has been getting, I'm not sure a level now would do any good.  I repeated it anyway.  Alphonsa Overall, MD, [facility] Pager: [phone] Office phone:  [phone]

## 2014-12-03 NOTE — Consult Note (Signed)
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 Referring Physician: Mitchel Honour  CC: Weakness  History is obtained from:patient  HPI: [name] [name] is a 39 y.o. female with a history of gastric sleeve performed in November. Since that time, she has had problems with n/v and more recently has had difficulty ambulating. She estimates that tingling began approximately 1 -2 months ago and her walking difficulties began at that time as well.    She denies diploplia.   ROS: A 14 point ROS was performed and is negative except as noted in the HPI.  Past Medical History  Diagnosis Date  . Hypertension   . ARF (acute renal failure) 09/2014    admitted with electrolyte disturbance, ARF to [facility].   Marland Kitchen Positive H. pylori test 06/2014    serum H pylori + and treated.   . Obesity     gastric sleeve bariatric surgery 06/2015     Family History: No hx nerve problems.  Social History: Tob: denies  Exam: Current vital signs: BP 111/79 mmHg  Pulse 98  Temp(Src) 98.4 F (36.9 C) (Oral)  Resp 16  Ht  (1.727 m)  Wt 74.208 kg (163 lb 9.6 oz)  BMI 24.88 kg/m2  SpO2 99%  LMP  (LMP Unknown) Vital signs in last 24 hours: Temp:  [98.4 F (36.9 C)-98.5 F (36.9 C)] 98.4 F (36.9 C) (04/06 1440) Pulse Rate:  [96-113] 98 (04/06 1440) Resp:  [16-18] 16 (04/06 1440) BP: (111-160)/(79-107) 111/79 mmHg (04/06 1440) SpO2:  [99 %-100 %] 99 % (04/06 1440)   Physical Exam  Constitutional: Appears well-developed and well-nourished.  Psych: Affect appropriate to situation Eyes: No scleral injection HENT: No OP obstrucion Head: Normocephalic.  Cardiovascular: Normal rate and regular rhythm.  Respiratory: Effort normal and breath sounds normal to anterior ascultation GI: Soft.  No distension. There is no tenderness.  Skin: WDI  Neuro: Mental Status: Patient is awake, alert, oriented to person, place, month, and situation. Gives year as 31.  Patient is  able to give a clear and coherent history. No signs of aphasia or neglect Cranial Nerves: II: Visual Fields are full. Pupils are equal, round, and reactive to light.   III,IV, VI: EOMI without ptosis or diploplia.  V: Facial sensation is symmetric to temperature VII: Facial movement is symmetric.  VIII: hearing is intact to voice X: Uvula elevates symmetrically XI: Shoulder shrug is symmetric. XII: tongue is midline without atrophy or fasciculations.  Motor: Tone is normal. Bulk is normal. 4/5 strength was present in bilateral arms, 4-/5 diffusely in legs.  Sensory: Sensation is decreased to LT below the mid thigh and in the hands, intact to temp, decreased to vibration in a length dependant manner up to the fingers.  Deep Tendon Reflexes: Absent DTR Cerebellar: FNF and HKS are with mild difficulty bilaterally.  Gait: she was unable to stand    I have reviewed labs in epic and the results pertinent to this consultation are: B12 663 ACH-R negative TSH nml A1 antitrypsin - nml Anti-smooth muscle ab - nml  I have reviewed the images obtained:MRI brain - negative.   Impression: 39 yo F with weakness and sensory loss. 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 She does not have any myelopathic signs. I think given the timing and clinical setting, relationship to her nutritional status is almost certainly the cause.  Possible vitamin deficiencies: Vitamin E(can cause myopathy/ataxia/neuropathy) B1 causes  neuropathy and ataxia but less often myopathy Copper can cause a sensory ataxia with neuropathy D can cause weakness, but rarely depressed reflexes.   It may be that she is deficient in multiple vitamins.    Recommendations: 1) Agree with vitamin supplementation including vitamin B1 2) will send vit e, d. Normal results may not be helpful given supplementation, but low results would be.  3) MRI  c-spine to look for posterior column changes 4) would consider outpatient EMG.  5)  If with supplementation, patient continues to worsen or does not improve, may need to consider further testing(e.g. Muscle/nerve biopsy).     Ritta SlotMcNeill Colin Norment, MD [facility] [phone]  If 7pm- 7am, please page neurology on call as listed in AMION.

## 2014-12-03 NOTE — Progress Notes (Signed)
NUTRITION FOLLOW UP  Intervention:   -Continue Glucerna Shake po TID, each supplement provides 220 kcal and 10 grams of protein -Continue 30 ml Prostat TID -Continue liquid MVI  Nutrition Dx:   Inadequate oral intake related to n/v, decreased appetite as evidenced by PO <25%; ongoing  Goal:   Pt will meet >90% of estimated nutritional needs; unmet  Monitor:   PO/supplement intake, labs, weight changes, I/O's  Assessment:   [name] [name] is a 39 y.o. female  With a hx of morbid obesity s/p gastric bypass in 12/15, HTN, who presents with progressive nausea and weakness. In the ED, pt noted to have difficulty ambulating. She was noted to have increased LFT's: alk phos of 223, alt 520, and ast 223 with bili of 1.8. GI was consulted through the ED and hospitalist consulted for admission.  Pt just advanced to a soft diet this afternoon. Noted 10-50% meal completion. Appetite continues to be poor. Pt is on multiple supplements including thiamine, liquid MVI, Glucerna TID, and Prostat TID, which pt is taking. RD will continue supplements to maximize nutritional intake.  Plan is to d/c to SNF once medically stable. Labs reviewed. WDL.   Height: Ht Readings from Last 1 Encounters:  11/26/14 5' 8" (1.727 m)    Weight Status:   Wt Readings from Last 1 Encounters:  11/26/14 163 lb 9.6 oz (74.208 kg)    Re-estimated needs:  Kcal: 2000-2200 Protein: 115-125 grams Fluid: 2.0-2.2 L  Skin: WDL  Diet Order: DIET SOFT Room service appropriate?: Yes; Fluid consistency:: Thin   Intake/Output Summary (Last 24 hours) at 12/03/14 1642 Last data filed at 12/03/14 1440  Gross per 24 hour  Intake 5839.5 ml  Output   2000 ml  Net 3839.5 ml    Last BM: 12/01/14   Labs:   Recent Labs Lab 11/30/14 1055 12/01/14 0628 12/01/14 2359 12/02/14 0014  NA 137 139 137  --   K 3.6 3.7 3.8  --   CL 105 108 106  --   CO2 23 23 26  --   BUN 5* 5* 8  --   CREATININE 0.79 0.61 0.67  --   CALCIUM  8.8 8.6 8.8  --   MG 2.1 1.8  --  1.9  GLUCOSE 108* 99 90  --     CBG (last 3)  No results for input(s): GLUCAP in the last 72 hours.  Scheduled Meds: . cyanocobalamin  1,000 mcg Intramuscular q morning - 10a  . feeding supplement (GLUCERNA SHAKE)  237 mL Oral TID BM  . feeding supplement (PRO-STAT SUGAR FREE 64)  30 mL Oral BID  . folic acid  1 mg Oral Daily  . heparin  5,000 Units Subcutaneous 3 times per day  . lactulose  20 g Oral TID  . losartan  50 mg Oral Daily  . metoprolol tartrate  25 mg Oral BID  . multivitamin  5 mL Oral Daily  . pantoprazole  40 mg Oral Daily  . sertraline  50 mg Oral Daily  . thiamine IV  100 mg Intravenous Daily  . zolpidem  5 mg Oral QHS    Continuous Infusions: . 0.9 % NaCl with KCl 20 mEq / L 75 mL/hr at 12/03/14 1427    Jenifer A. Williams, RD, LDN, CDE Pager: [phone] After hours Pager: [phone]   

## 2014-12-03 NOTE — Progress Notes (Addendum)
PROGRESS NOTE  [name] [name] UJW:[id number] DOB: 06/09/1976 DOA: 11/26/2014 PCP: No primary care provider on file.  HPI/Recap of past 24 hours: Weakness unchanged, couldn't recall events of this am  Assessment/Plan:  Elevation of LFT with Nausea -likely due to GB sludge, HIDA with low GBEF -GI and CCS following -no plans for Lap chole at this time, to be considered down the road once neurologically better  Hypokalemia/hypomagnesemia:  -replace k, replace mag.  -Continue gentle hydration.  Confusion/Encephalopathy weakness and sensory loss -Wernickes encephalopathy is a possibility -on Thiamine, will change to IV, will also change B12 to Iv for 2days given posterior cord symptoms as well -MRI brain no acute finding, ammonia level wnl.  -Tsh/b12/folate wnl, rpr/hiv neg, thiamine level pending.  -She was started on b12/folic acid/thamine supplement on 4/3, no changed to IV -? Component of Anxiety too? Psych started antidepressant.  HTN: continue cozaar, hold hctz. increase lopressor due to persistent sinus tachycardia. TSH wnl. Hr better on lopressor, continue titrate  FTT/progressive weakness: acetycholine receptor antibody pending. Mri brain/spine no acute findings. PT/OT/SNF -continue Thiamine and B12  Obesity s/p gastric bypass. Reported 80pounds of weight loss since bypass procedure in 06/2014. Dietician consult. Limited oral intake has been on gentle hydration since admission.  -Stop hydration if po intake better.  Solid hepatic lesions (CT [facility] Feb 2016) Needs non urgent biopsy  Code Status: full  Family Communication: patient   Disposition Plan: snf   Consultants:  GI Psych surgery Procedures:  HIDA/ab us/mri brain/spine  Antibiotics:  none   Objective: BP 142/94 mmHg  Pulse 113  Temp(Src) 98.5 F (36.9 C) (Oral)  Resp 18  Ht 5\' 8"  (1.727 m)  Wt 74.208 kg (163 lb 9.6 oz)  BMI 24.88 kg/m2  SpO2 99%  LMP  (LMP Unknown)  Intake/Output  Summary (Last 24 hours) at 12/03/14 1317 Last data filed at 12/03/14 1247  Gross per 24 hour  Intake 5959.5 ml  Output   3300 ml  Net 2659.5 ml   Filed Weights   11/26/14 1755  Weight: 74.208 kg (163 lb 9.6 oz)    Exam:   General:  NAD, weak  Cardiovascular: RRR  Respiratory: CTABL  Abdomen: Soft/ND/NT, positive BS  Musculoskeletal: No Edema  Neuro:orienetd to self, place only not oriented to time  Poor short term memory. Generalized weakness, loss of pin prick  And paresthesias noted.  Data Reviewed: Basic Metabolic Panel:  Recent Labs Lab 11/28/14 0608 11/29/14 0543 11/30/14 1055 12/01/14 0628 12/01/14 2359 12/02/14 0014  NA 139 141 137 139 137  --   K 3.2* 3.5 3.6 3.7 3.8  --   CL 104 111 105 108 106  --   CO2 30 25 23 23 26   --   GLUCOSE 88 95 108* 99 90  --   BUN <5* <5* 5* 5* 8  --   CREATININE 0.67 0.61 0.79 0.61 0.67  --   CALCIUM 8.8 8.6 8.8 8.6 8.8  --   MG  --  1.6 2.1 1.8  --  1.9   Liver Function Tests:  Recent Labs Lab 11/28/14 0608 11/29/14 0543 11/30/14 1055 12/01/14 0628 12/01/14 2359  AST 89* 88* 84* 141* 163*  ALT 284* 238* 216* 214* 244*  ALKPHOS 196* 193* 193* 200* 224*  BILITOT 1.0 0.8 0.8 0.7 0.9  PROT 6.5 5.7* 6.5 5.6* 5.6*  ALBUMIN 2.9* 2.6* 2.9* 2.6* 2.7*   No results for input(s): LIPASE, AMYLASE in the last 168 hours.  Recent Labs  Lab 11/27/14 1500  AMMONIA 31   CBC:  Recent Labs Lab 11/27/14 0656 11/27/14 1829 11/28/14 1610 11/29/14 0543 12/01/14 0628 12/01/14 0906  WBC 7.5  --  8.3 8.2 6.3 6.5  HGB 11.3*  --  11.4* 10.4* 9.9* 10.6*  HCT 32.8* 37.6 33.3* 30.2* 29.0* 31.5*  MCV 81.4  --  80.6 79.7 81.2 80.6  PLT 271  --  285 302 290 343   Cardiac Enzymes:    Recent Labs Lab 11/28/14 0608  CKTOTAL 43   BNP (last 3 results) No results for input(s): BNP in the last 8760 hours.  ProBNP (last 3 results) No results for input(s): PROBNP in the last 8760 hours.  CBG: No results for input(s):  GLUCAP in the last 168 hours.  No results found for this or any previous visit (from the past 240 hour(s)).   Studies: No results found.  Scheduled Meds: . cyanocobalamin  1,000 mcg Intramuscular q morning - 10a  . feeding supplement (GLUCERNA SHAKE)  237 mL Oral TID BM  . feeding supplement (PRO-STAT SUGAR FREE 64)  30 mL Oral BID  . folic acid  1 mg Oral Daily  . heparin  5,000 Units Subcutaneous 3 times per day  . lactulose  20 g Oral TID  . losartan  50 mg Oral Daily  . metoprolol tartrate  25 mg Oral BID  . multivitamin  5 mL Oral Daily  . pantoprazole  40 mg Oral Daily  . sertraline  50 mg Oral Daily  . thiamine IV  100 mg Intravenous Daily  . zolpidem  5 mg Oral QHS    Continuous Infusions: . 0.9 % NaCl with KCl 20 mEq / L 75 mL/hr at 12/02/14 2350     Time spent: >71mins  Delio Slates MD, [facility] Pager [phone]. If 7PM-7AM, please contact night-coverage at [email or web address], password [facility] 12/03/2014, 1:17 PM  LOS: 7 days

## 2014-12-04 DIAGNOSIS — K8012 Calculus of gallbladder with acute and chronic cholecystitis without obstruction: Secondary | ICD-10-CM

## 2014-12-04 DIAGNOSIS — R29898 Other symptoms and signs involving the musculoskeletal system: Secondary | ICD-10-CM

## 2014-12-04 LAB — COMPREHENSIVE METABOLIC PANEL
ALK PHOS: 194 U/L — AB (ref 39–117)
ALT: 189 U/L — AB (ref 0–35)
AST: 90 U/L — AB (ref 0–37)
Albumin: 2.8 g/dL — ABNORMAL LOW (ref 3.5–5.2)
Anion gap: 9 (ref 5–15)
BILIRUBIN TOTAL: 0.8 mg/dL (ref 0.3–1.2)
BUN: 9 mg/dL (ref 6–23)
CHLORIDE: 109 mmol/L (ref 96–112)
CO2: 22 mmol/L (ref 19–32)
CREATININE: 0.64 mg/dL (ref 0.50–1.10)
Calcium: 9.1 mg/dL (ref 8.4–10.5)
GFR calc non Af Amer: >90 mL/min (ref >90)
GLUCOSE: 91 mg/dL (ref 70–99)
POTASSIUM: 3.9 mmol/L (ref 3.5–5.1)
Sodium: 140 mmol/L (ref 135–145)
Total Protein: 6 g/dL (ref 6.0–8.3)

## 2014-12-04 LAB — CBC
HEMATOCRIT: 30 % — AB (ref 36.0–46.0)
HEMOGLOBIN: 10.2 g/dL — AB (ref 12.0–15.0)
MCH: 28.1 pg (ref 26.0–34.0)
MCHC: 34 g/dL (ref 30.0–36.0)
MCV: 82.6 fL (ref 78.0–100.0)
Platelets: 330 10*3/uL (ref 150–400)
RBC: 3.63 MIL/uL — ABNORMAL LOW (ref 3.87–5.11)
RDW: 20 % — ABNORMAL HIGH (ref 11.5–15.5)
WBC: 6.6 10*3/uL (ref 4.0–10.5)

## 2014-12-04 MED ORDER — VITAMIN B-1 100 MG PO TABS
100.0000 mg | ORAL_TABLET | Freq: Every day | ORAL | Status: DC
  Administered 2014-12-04 – 2014-12-05 (×2): 100 mg via ORAL
  Filled 2014-12-04 (×3): qty 1

## 2014-12-04 NOTE — Clinical Social Work Note (Signed)
Clinical Social Worker continuing to follow patient and family for support and discharge planning needs.  CS[facility] information and left a message with admissions coordinator regarding the possibility for discharge tomorrow.  CSW remains available for support and to facilitate patient discharge needs once medically stable.  [name], [place] [phone]

## 2014-12-04 NOTE — Progress Notes (Signed)
Patient ID: [name] [name], female   DOB: 1976-01-16, 39 y.o.   MRN: [id number]    Subjective: Pt is ok today.  Still with some nausea, but no emesis for 2 weeks.  Tolerating most of her diet.  Objective: Vital signs in last 24 hours: Temp:  [98.4 F (36.9 C)-99.2 F (37.3 C)] 98.4 F (36.9 C) (04/07 0504) Pulse Rate:  [96-110] 110 (04/07 0504) Resp:  [16-18] 17 (04/07 0504) BP: (111-151)/(79-126) 142/91 mmHg (04/07 0504) SpO2:  [99 %-100 %] 99 % (04/07 0504) Last BM Date: 12/01/14  Intake/Output from previous day: 04/06 0701 - 04/07 0700 In: 1122 [P.O.:597; I.V.:525] Out: 2200 [Urine:2200] Intake/Output this shift: Total I/O In: -  Out: 600 [Urine:600]  PE: Abd: soft, minimally tender, +BS, ND  Lab Results:   Recent Labs  12/01/14 0906 12/04/14 0622  WBC 6.5 6.6  HGB 10.6* 10.2*  HCT 31.5* 30.0*  PLT 343 330   BMET  Recent Labs  12/01/14 2359 12/04/14 0622  NA 137 140  K 3.8 3.9  CL 106 109  CO2 26 22  GLUCOSE 90 91  BUN 8 9  CREATININE 0.67 0.64  CALCIUM 8.8 9.1   PT/INR No results for input(s): LABPROT, INR in the last 72 hours. CMP     Component Value Date/Time   NA 140 12/04/2014 0622   K 3.9 12/04/2014 0622   CL 109 12/04/2014 0622   CO2 22 12/04/2014 0622   GLUCOSE 91 12/04/2014 0622   BUN 9 12/04/2014 0622   CREATININE 0.64 12/04/2014 0622   CALCIUM 9.1 12/04/2014 0622   PROT 6.0 12/04/2014 0622   ALBUMIN 2.8* 12/04/2014 0622   AST 90* 12/04/2014 0622   ALT 189* 12/04/2014 0622   ALKPHOS 194* 12/04/2014 0622   BILITOT 0.8 12/04/2014 0622   GFRNONAA >90 12/04/2014 0622   GFRAA >90 12/04/2014 0622   Lipase     Component Value Date/Time   LIPASE 48 11/26/2014 1100       Studies/Results: Mr Cervical Spine Wo Contrast  12/03/2014   CLINICAL DATA:  Posterior column changes with weakness and sensory loss beginning 1-2 months ago. Weakness disproportionate to sensory loss suspicious for a combined of myelopathic/neuropathic process.  Status post gastric sleeve November 2015.  EXAM: MRI CERVICAL SPINE WITHOUT CONTRAST  TECHNIQUE: Multiplanar, multisequence MR imaging of the cervical spine was performed. No intravenous contrast was administered.  COMPARISON:  MRI of the brain November 23, 2014  FINDINGS: Cervical vertebral bodies and posterior elements are intact and aligned with mild broad reversed cervical lordosis. Intervertebral discs demonstrate normal morphology and signal characteristics. Mild chronic discogenic endplate change at B2-0, no STIR signal abnormality to suggest acute osseous process.  Cervical spinal cord appears normal morphology and signal characteristics from the cervical medullary junction to level of T3-4, the most caudal well visualized level. Faint STIR signal in the RIGHT posterior column at the level of C2-3, possibly present on the axial T2 is limited by moderate motion the axial T2 sequences. No syrinx or myelomalacia. No susceptibility artifact to suggest blood products. Craniocervical junction is maintained. Included prevertebral and paraspinal soft tissues are normal.  Level by level evaluation:  C2-3 and C3-4: No disc bulge, canal stenosis nor neural foraminal narrowing.  C4-5 and C5-6: Minimal annular bulging without canal stenosis or neural foraminal narrowing.  C6 C6-7 and C7-T1: No disc bulge, canal stenosis nor neural foraminal narrowing.  IMPRESSION: Motion degraded examination without convincing evidence of this cervical spinal cord signal abnormality (faint  T2 hyperintense signal at C2-3 within the dorsal spinal column is likely motion artifact). Though none is suspected, contrast-enhanced sequences could be obtained if clinical concern for acute inflammatory changes.  Broad reversed cervical lordosis without acute fracture, malalignment nor neurocompressive changes.   Electronically Signed   By: Elon Alas   On: 12/03/2014 23:00    Anti-infectives: Anti-infectives    None        Assessment/Plan   Transaminitis (ALT/AST) Elevated alk phos Gallbladder sludge with decreased EF -patient has biliary dyskinesia and will need this addressed in the future, but given her current problems, this is much less important right now. -Needs to follow up with her GI docs as an outpatient             -Soft diet as tolerated             -patient has had UGI and EGD within the last several months that do not show a mechanical obstruction.  The patient is able to eat some and keep it down, albeit still with some nausea, but no emesis.  This is a clinically sign that she does not have a high grade obstruction either.  No acute surgical issues.  She should continue to have her vitamin levels checked and replaced as this is likely the source of a lot of her problems.  Will defer to primary service, GI, and neurology.  She needs to follow up with her bariatric surgeon in [place] upon discharge.  Nothing else to added surgically at this time.  We will sign off.   Solid hepatic lesions (CT [facility] Feb 2016) -GI would like liver biopsy - IR would be helpful Morbid obesity - resolved -BMI now 24.88  S/p sleeve gastrectomy ([name])  -Lost [phone] -Follow up with Dr. [name] at discharge Prolonged post-op N/V -EGD in the last few months shows gastritis, erosion at the surgery site -Protonix daily -Poor oral intake due to anorexia Anxiety disorder with mixed anxiety and depressed mood Ataxia/Confusion/Encephalopathy -Brain/Lumbar spine MRI negative Constipation  LOS: 8 days    OSBORNE,KELLY E 12/04/2014, 8:48 AM Pager: [phone]  Agree with above.  Alphonsa Overall, MD, [facility] Pager: [phone] Office phone:  [phone]

## 2014-12-04 NOTE — Progress Notes (Signed)
Subjective: Continues to feel weak and have decreased sensation from mid chest to feet.  No SOB.  No difficulties with urination.   Exam: Filed Vitals:   12/04/14 0504  BP: 142/91  Pulse: 110  Temp: 98.4 F (36.9 C)  Resp: 17    HEENT-  Normocephalic, no lesions, without obvious abnormality.  Normal external eye and conjunctiva.  Normal TM's bilaterally.  Normal auditory canals and external ears. Normal external nose, mucus membranes and septum.  Normal pharynx. Cardiovascular- S1, S2 normal, pulses palpable throughout   Lungs- chest clear, no wheezing, rales, normal symmetric air entry Abdomen- normal findings: bowel sounds normal Extremities- no edema Lymph-no adenopathy palpable Musculoskeletal-no joint tenderness, deformity or swelling Skin-warm and dry, no hyperpigmentation, vitiligo, or suspicious lesions    Gen: In bed, NAD MS: alert and oriented. States she is in the hospital and the year is 2016.  CN: PERRLA, EOMI, TML Face equal and symmetrical.  Motor: 4/5 bilateral arms, 4-/5 bilateral legs--able to lift the right leg 3 inches off the bed and the left leg 2 inches of the bed.  Sensory: states her sensation is decreased from mid chest down on the from and from lower back down to fee on her back side. Continues to have decreased vibratory sensation in fingers to toes.  DTR: 2+ bilateral bicep and brachioradialis.  No KJ or AJ Plantars mute  Pertinent Labs: Vit D, E, B1 and copper pending RPR (-) HIV (-) Acetylcholine Abx (-)  IMAGING: IMPRESSION: Motion degraded examination without convincing evidence of this cervical spinal cord signal abnormality (faint T2 hyperintense signal at C2-3 within the dorsal spinal column is likely motion artifact). Though none is suspected, contrast-enhanced sequences could be obtained if clinical concern for acute inflammatory changes.    Impression:  39 yo F with weakness and sensory loss. Weakness seems out of proportion to  her sensory loss making it suspicious for a combined myopathic/neuropathic process. She does not have any myelopathic signs. Given the timing and clinical setting, relationship to her nutritional status is almost certainly the cause. MRI C-spine unrevealing  Possible vitamin deficiencies: Vitamin E(can cause myopathy/ataxia/neuropathy) B1 causes neuropathy and ataxia but less often myopathy Copper can cause a sensory ataxia with neuropathy D can cause weakness, but rarely depressed reflexes.   It may be that she is deficient in multiple vitamins.   Recommendations: 1) Continue to treat nutritional deficiency 2) If no improvement will need out patient EMG and consideration of Muscle/nerve biopsy  Neurology will S/O  12/04/2014, 11:01 AM Felicie Mornavid Smith PA-C [facility] [phone]

## 2014-12-04 NOTE — Progress Notes (Signed)
Received Consult from Hospitalist for Bariatric Education.  Thank you for the consult.    Spoke with patient in detail in reference to bariatric eating.  Provided patient with my contact information for further needs as well as handouts on:  Nutrition Goals, Protein Shakes, Post-Op diet progression, Snack ideas, rules for eating such as bite size, appropriate chewing, portion sizes, protein and fluid goals, as well as vitamin and mineral supplementation.  Also provided information on Support Group offerings from [employer] [employer] bariatric program, fast food guide, and non -milky protein options.  Spent 1.5 hours with patient.  At 4 months post bariatric surgery patient should be maintaining 60 grams of protein per day, low carbohydrate intake, no concentrated sweets, and non-starchy vegetables that are well cooked.  A meal should consist of approximately 2-3 ounces of a soft MOIST protein, and if the patient can tolerate additional non-starchy vegetable.  The soft diet consists of many foods that are not appropriate for this stage (breads will not be tolerated for most patients at this stage).  A bariatric advanced diet and Unjury protein shakes prn would seem more appropriate.  Most meals at this point should be very small (4-5 bites) and she should eat 3 times a day with high protein low carbohydrate snacks.   Skip EstimableLaurie Deaton MSN, RN Bariatric Nurse Coordinator [phone]

## 2014-12-04 NOTE — Progress Notes (Addendum)
PROGRESS NOTE  [name] [name] WUJ:[id number] DOB: Feb 10, 1976 DOA: 11/26/2014 PCP: No primary care provider on file.  HPI/Recap of past 24 hours: Weakness unchanged, Po intake remains poor  Assessment/Plan:  Elevation of LFT with Nausea -likely due to GB sludge, HIDA with low GBEF -GI and CCS following -no plans for Lap chole at this time, to be considered down the road once neurologically better -supportive care  -Diet education, will ask Bariatric RD/RN to evaluate   Hypokalemia/hypomagnesemia:  -replace k, replace mag.  -Continue gentle hydration.  Confusion/Encephalopathy: weakness and sensory loss -due to nutritional deficiencies -on Thiamine, changed to IV, continue Iv cyanocobalamin -MRI brain no acute finding, ammonia level wnl.  -TSH/B12/folate wnl, rpr/hiv neg, thiamine level pending, copper and Vitamin E pending too -She was started on b12/folic acid/thamine supplement on 4/3, now changed to IV -? Component of Anxiety too? Psych started antidepressant.  HTN: continue cozaar, hold hctz. increase lopressor due to persistent sinus tachycardia. TSH wnl. Hr better on lopressor, continue titrate  Obesity s/p gastric bypass. Reported 80pounds of weight loss since bypass procedure in 06/2014. Dietician consult.   Solid hepatic lesions (CT [facility] Feb 2016) Needs non urgent biopsy  Code Status: full  Family Communication: patient   Disposition Plan: snf   Consultants:  GI Psych surgery Procedures:  HIDA/ab us/mri brain/spine  Antibiotics:  none   Objective: BP 142/91 mmHg  Pulse 110  Temp(Src) 98.4 F (36.9 C) (Oral)  Resp 17  Ht  (1.727 m)  Wt 74.208 kg (163 lb 9.6 oz)  BMI 24.88 kg/m2  SpO2 99%  LMP  (LMP Unknown)  Intake/Output Summary (Last 24 hours) at 12/04/14 1233 Last data filed at 12/04/14 1150  Gross per 24 hour  Intake    480 ml  Output   3200 ml  Net  -2720 ml   Filed Weights   11/26/14 1755  Weight: 74.208 kg (163 lb 9.6  oz)    Exam:   General:  NAD, weak  Cardiovascular: RRR  Respiratory: CTABL  Abdomen: Soft/ND/NT, positive BS  Musculoskeletal: No Edema  Neuro:orienetd to self, place only not oriented to time  Poor short term memory. Generalized weakness, loss of pin prick  And paresthesias noted.  Data Reviewed: Basic Metabolic Panel:  Recent Labs Lab 11/29/14 0543 11/30/14 1055 12/01/14 0628 12/01/14 2359 12/02/14 0014 12/04/14 0622  NA 141 137 139 137  --  140  K 3.5 3.6 3.7 3.8  --  3.9  CL 111 105 108 106  --  109  CO2 --  22  GLUCOSE 95 108* 99 90  --  91  BUN <5* 5* 5* 8  --  9  CREATININE 0.61 0.79 0.61 0.67  --  0.64  CALCIUM 8.6 8.8 8.6 8.8  --  9.1  MG 1.6 2.1 1.8  --  1.9  --    Liver Function Tests:  Recent Labs Lab 11/29/14 0543 11/30/14 1055 12/01/14 0628 12/01/14 2359 12/04/14 0622  AST 88* 84* 141* 163* 90*  ALT 238* 216* 214* 244* 189*  ALKPHOS 193* 193* 200* 224* 194*  BILITOT 0.8 0.8 0.7 0.9 0.8  PROT 5.7* 6.5 5.6* 5.6* 6.0  ALBUMIN 2.6* 2.9* 2.6* 2.7* 2.8*   No results for input(s): LIPASE, AMYLASE in the last 168 hours.  Recent Labs Lab 11/27/14 1500  AMMONIA 31   CBC:  Recent Labs Lab 11/28/14 0608 11/29/14 0543 12/01/14 0628 12/01/14 0906 12/04/14 0622  WBC 8.3 8.2  6.3 6.5 6.6  HGB 11.4* 10.4* 9.9* 10.6* 10.2*  HCT 33.3* 30.2* 29.0* 31.5* 30.0*  MCV 80.6 79.7 81.2 80.6 82.6  PLT 285 302 290 343 330   Cardiac Enzymes:    Recent Labs Lab 11/28/14 0608  CKTOTAL 43   BNP (last 3 results) No results for input(s): BNP in the last 8760 hours.  ProBNP (last 3 results) No results for input(s): PROBNP in the last 8760 hours.  CBG: No results for input(s): GLUCAP in the last 168 hours.  No results found for this or any previous visit (from the past 240 hour(s)).   Studies: Mr Cervical Spine Wo Contrast  12/03/2014   CLINICAL DATA:  Posterior column changes with weakness and sensory loss beginning 1-2 months  ago. Weakness disproportionate to sensory loss suspicious for a combined of myelopathic/neuropathic process. Status post gastric sleeve November 2015.  EXAM: MRI CERVICAL SPINE WITHOUT CONTRAST  TECHNIQUE: Multiplanar, multisequence MR imaging of the cervical spine was performed. No intravenous contrast was administered.  COMPARISON:  MRI of the brain November 23, 2014  FINDINGS: Cervical vertebral bodies and posterior elements are intact and aligned with mild broad reversed cervical lordosis. Intervertebral discs demonstrate normal morphology and signal characteristics. Mild chronic discogenic endplate change at C5-6, no STIR signal abnormality to suggest acute osseous process.  Cervical spinal cord appears normal morphology and signal characteristics from the cervical medullary junction to level of T3-4, the most caudal well visualized level. Faint STIR signal in the RIGHT posterior column at the level of C2-3, possibly present on the axial T2 is limited by moderate motion the axial T2 sequences. No syrinx or myelomalacia. No susceptibility artifact to suggest blood products. Craniocervical junction is maintained. Included prevertebral and paraspinal soft tissues are normal.  Level by level evaluation:  C2-3 and C3-4: No disc bulge, canal stenosis nor neural foraminal narrowing.  C4-5 and C5-6: Minimal annular bulging without canal stenosis or neural foraminal narrowing.  C6 C6-7 and C7-T1: No disc bulge, canal stenosis nor neural foraminal narrowing.  IMPRESSION: Motion degraded examination without convincing evidence of this cervical spinal cord signal abnormality (faint T2 hyperintense signal at C2-3 within the dorsal spinal column is likely motion artifact). Though none is suspected, contrast-enhanced sequences could be obtained if clinical concern for acute inflammatory changes.  Broad reversed cervical lordosis without acute fracture, malalignment nor neurocompressive changes.   Electronically Signed   By:  Awilda Metroourtnay  Bloomer   On: 12/03/2014 23:00    Scheduled Meds: . feeding supplement (GLUCERNA SHAKE)  237 mL Oral TID BM  . feeding supplement (PRO-STAT SUGAR FREE 64)  30 mL Oral BID  . folic acid  1 mg Oral Daily  . heparin  5,000 Units Subcutaneous 3 times per day  . lactulose  20 g Oral TID  . losartan  50 mg Oral Daily  . metoprolol tartrate  25 mg Oral BID  . multivitamin  5 mL Oral Daily  . pantoprazole  40 mg Oral Daily  . sertraline  50 mg Oral Daily  . thiamine  100 mg Oral Daily  . zolpidem  5 mg Oral QHS    Continuous Infusions: . 0.9 % NaCl with KCl 20 mEq / L 75 mL/hr at 12/04/14 0901     Time spent: >835mins  Avielle Imbert MD, [facility] Pager [phone]. If 7PM-7AM, please contact night-coverage at [email or web address], password [email or web address] 12/04/2014, 12:33 PM  LOS: 8 days

## 2014-12-04 NOTE — Progress Notes (Signed)
Physical Therapy Treatment Patient Details Name: [name] [name] MRN: [id number] DOB: [birth year] Today's Date: 12/04/2014    History of Present Illness Pt is a 39 y.o. Female PMH of HTN, obesity, and s/p gastric bypass in 07/2014 admitted 11/26/14 for nausea, elevated LFT, weakness and ataxia.     PT Comments    Improvements in movement quality, LE strength and gait/activity tolerance, though still with significant altered sensation below chest level.  Follow Up Recommendations  SNF     Equipment Recommendations  Rolling walker with 5" wheels    Recommendations for Other Services       Precautions / Restrictions Precautions Precautions: Fall    Mobility  Bed Mobility                  Transfers Overall transfer level: Needs assistance Equipment used: Rolling walker (2 wheeled) Transfers: Sit to/from Stand Sit to Stand: Mod assist         General transfer comment: cues and assist to come more forward. Over 5 standing trials, pt improved in general technique and coming forward specifically  Ambulation/Gait Ambulation/Gait assistance: Min assist Ambulation Distance (Feet): 22 Feet (then additional 28, 22 and 8 feet respectively with RW) Assistive device: Rolling walker (2 wheeled) Gait Pattern/deviations: Step-through pattern;Ataxic Gait velocity: slow   General Gait Details: able to minimally flex knees to advance forward.  Much more ataxic and scissoring on the R LE, some knee wobble, but no tin soldier walking or collapsing of the knees.   Stairs            Wheelchair Mobility    Modified Rankin (Stroke Patients Only)       Balance   Sitting-balance support: No upper extremity supported Sitting balance-Leahy Scale: Fair Sitting balance - Comments: trunk improving, but still weak/     Standing balance-Leahy Scale: Poor Standing balance comment: But able to stand statically while locking and unlocking her knees.                     Cognition Arousal/Alertness: Awake/alert Behavior During Therapy: WFL for tasks assessed/performed Overall Cognitive Status: Within Functional Limits for tasks assessed                      Exercises      General Comments        Pertinent Vitals/Pain Pain Assessment: No/denies pain Faces Pain Scale: Hurts little more Pain Location: trunk and B LE's Pain Descriptors / Indicators: Tingling    Home Living                      Prior Function            PT Goals (current goals can now be found in the care plan section) Acute Rehab PT Goals Patient Stated Goal: get back to caring for house, kids, work PT Goal Formulation: With patient Time For Goal Achievement: 12/11/14 Potential to Achieve Goals: Good Progress towards PT goals: Progressing toward goals    Frequency       PT Plan Current plan remains appropriate    Co-evaluation             End of Session   Activity Tolerance: Patient tolerated treatment well Patient left: in chair;with call bell/phone within reach     Time: 1210-1235 PT Time Calculation (min) (ACUTE ONLY): 25 min  Charges:  $Gait Training: 8-22 mins $Therapeutic Activity: 8-22 mins  G Codes:      Nihal Doan, Eliseo GumKenneth V 12/04/2014, 1:45 PM  12/04/2014  [place] BingKen Charon Akamine, PT [phone]  (pager)

## 2014-12-05 LAB — VITAMIN D 25 HYDROXY (VIT D DEFICIENCY, FRACTURES): Vit D, 25-Hydroxy: 19.5 ng/mL — ABNORMAL LOW (ref 30.0–100.0)

## 2014-12-05 MED ORDER — HYDRALAZINE HCL 20 MG/ML IJ SOLN
10.0000 mg | INTRAMUSCULAR | Status: DC | PRN
  Administered 2014-12-05: 10 mg via INTRAVENOUS
  Filled 2014-12-05: qty 1

## 2014-12-05 MED ORDER — THIAMINE HCL 100 MG/ML IJ SOLN
100.0000 mg | Freq: Every day | INTRAMUSCULAR | Status: DC
  Administered 2014-12-05 – 2014-12-08 (×4): 100 mg via INTRAVENOUS
  Filled 2014-12-05 (×4): qty 1

## 2014-12-05 MED ORDER — CYANOCOBALAMIN 1000 MCG/ML IJ SOLN
1000.0000 ug | Freq: Every morning | INTRAMUSCULAR | Status: AC
  Administered 2014-12-05 – 2014-12-06 (×2): 1000 ug via INTRAMUSCULAR
  Filled 2014-12-05 (×2): qty 1

## 2014-12-05 NOTE — Clinical Social Work Note (Signed)
Per MD patient likely not ready for DC until early next week. Please note [facility] and the facility states they have BCBS auth good through the weekend. IF the patient is ready to DC over the weekend, [facility]. Report left for weekend CSW.   Roddie McBryant Rockne Dearinger MSW, [place], [place], [phone]

## 2014-12-05 NOTE — Progress Notes (Signed)
Occupational Therapy Treatment Patient Details Name: [name] FantasiaChristel Fehrenbach MRN: [id number] DOB: 12/03/1975 Today's Date: 12/05/2014    History of present illness Pt is a 39 y.o. Female PMH of HTN, obesity, and s/p gastric bypass in 07/2014 admitted 11/26/14 for nausea, elevated LFT, weakness and ataxia.    OT comments  Pt seen today for functional mobility for toileting. Pt c/o increased tingling today but able to progress to bathroom with Min/Mod A. Pt will continue to benefit from acute OT per POC.   Follow Up Recommendations  SNF;Supervision/Assistance - 24 hour    Equipment Recommendations  Other (comment) (defer to SNF)    Recommendations for Other Services      Precautions / Restrictions Precautions Precautions: Fall Restrictions Weight Bearing Restrictions: No       Mobility Bed Mobility Overal bed mobility: Needs Assistance Bed Mobility: Supine to Sit     Supine to sit: Min assist     General bed mobility comments: Min A to advance LEs to EOB. Min A hand held assist to elevate trunk. Pt also required min A to pull self to EOB.   Transfers Overall transfer level: Needs assistance Equipment used: Rolling walker (2 wheeled) Transfers: Sit to/from Stand Sit to Stand: Min assist;Mod assist         General transfer comment: Min/Mod A to stand. VC's for hand placement.         ADL Overall ADL's : Needs assistance/impaired Eating/Feeding: Independent;Sitting                       Toilet Transfer: Minimal assistance;+2 for safety/equipment;RW;Comfort height toilet;Grab bars   Toileting- Clothing Manipulation and Hygiene: Supervision/safety;Sitting/lateral lean Toileting - Clothing Manipulation Details (indicate cue type and reason): pt able to perform pericare with sitting/lateral leans while on toilet     Functional mobility during ADLs: Minimal assistance;Rolling walker General ADL Comments: PT c/o significant tingling below chest and in hands today,  requiring min A to ambulate to bathroom. Pt  had difficulty standing from toilet and required closer to Mod A to stand. Returned to recliner and encouraged to sit up through dinner. Educated on calling NT/RN for assist back to bed.                 Cognition  Arousal/Alertness: Awake/Alert Behavior During Therapy: WFL for tasks assessed/performed Overall Cognitive Status: Within Functional Limits for tasks assessed                                    Pertinent Vitals/ Pain       Pain Assessment: Faces Faces Pain Scale: Hurts little more Pain Location: B LEs (calves) Pain Descriptors / Indicators: Tightness;Tingling Pain Intervention(s): Limited activity within patient's tolerance;Monitored during session;Repositioned         Frequency Min 2X/week     Progress Toward Goals  OT Goals(current goals can now be found in the care plan section)  Progress towards OT goals: Progressing toward goals  Acute Rehab OT Goals Patient Stated Goal: to go to rehab  Plan Discharge plan remains appropriate       End of Session Equipment Utilized During Treatment: Gait belt;Rolling walker   Activity Tolerance Patient limited by fatigue   Patient Left in chair;with call bell/phone within reach;with chair alarm set   Nurse Communication          Time: [id number] OT Time Calculation (min): 27 min  Charges: OT General Charges $OT Visit: 1 Procedure OT Treatments $Self Care/Home Management : 23-37 mins  Rae Lips 12/05/2014, 3:14 PM  Carney Living, OTR/L Occupational Therapist [phone] (pager)

## 2014-12-05 NOTE — Progress Notes (Signed)
PROGRESS NOTE  [name] [name] KGM:[id number] DOB: 02-10-1976 DOA: 11/26/2014 PCP: No primary care provider on file.  HPI/Recap of past 24 hours: Weakness starting to improve, , Po intake remains poor  Assessment/Plan:  Elevation of LFT with Nausea -likely due to GB sludge, HIDA with low GBEF -GI and CCS signed off -no plans for Lap chole at this time, to be considered down the road once neurologically better -supportive care  -Diet education, Bariatric RN consult appreciated  Hypokalemia/hypomagnesemia:  -replace k, replace mag.  -Continue gentle hydration.  Confusion/Encephalopathy: weakness and sensory loss -due to nutritional deficiencies -on Thiamine, changed to IV, continue IM cyanocobalamin -MRI brain no acute finding, ammonia level wnl.  -TSH/B12/folate wnl, rpr/hiv neg, thiamine level pending, copper and Vitamin E pending too -She was started on b12/folic acid/thamine supplement on 4/3, now changed to IV -Vitamin B1, E and copper levels pending -PT, ambulate  HTN: continue cozaar, hold hctz. increase lopressor due to persistent sinus tachycardia. TSH wnl. Hr better on lopressor, continue titrate  Obesity s/p gastric bypass. Reported 80pounds of weight loss since bypass procedure in 06/2014. Dietician consult.   Solid hepatic lesions (CT [facility] Feb 2016) Needs non urgent biopsy, FU with Outpatient GI  Code Status: full  Family Communication: patient   Disposition Plan: snf   Consultants:  GI Psych surgery Procedures:  HIDA/ab us/mri brain/spine  Antibiotics:  none   Objective: BP 136/92 mmHg  Pulse 105  Temp(Src) 98.7 F (37.1 C) (Oral)  Resp 20  Ht  (1.727 m)  Wt 74.208 kg (163 lb 9.6 oz)  BMI 24.88 kg/m2  SpO2 100%  LMP  (LMP Unknown)  Intake/Output Summary (Last 24 hours) at 12/05/14 1318 Last data filed at 12/05/14 1100  Gross per 24 hour  Intake 1991.25 ml  Output   1800 ml  Net 191.25 ml   Filed Weights   11/26/14 1755   Weight: 74.208 kg (163 lb 9.6 oz)    Exam:   General:  NAD, weak  Cardiovascular: RRR  Respiratory: CTABL  Abdomen: Soft/ND/NT, positive BS  Musculoskeletal: No Edema  Neuro:orienetd to self, place only not oriented to time  Poor short term memory. Generalized weakness, loss of pin prick  And paresthesias noted.  Data Reviewed: Basic Metabolic Panel:  Recent Labs Lab 11/29/14 0543 11/30/14 1055 12/01/14 0628 12/01/14 2359 12/02/14 0014 12/04/14 0622  NA 141 137 139 137  --  140  K 3.5 3.6 3.7 3.8  --  3.9  CL 111 105 108 106  --  109  CO2 --  22  GLUCOSE 95 108* 99 90  --  91  BUN <5* 5* 5* 8  --  9  CREATININE 0.61 0.79 0.61 0.67  --  0.64  CALCIUM 8.6 8.8 8.6 8.8  --  9.1  MG 1.6 2.1 1.8  --  1.9  --    Liver Function Tests:  Recent Labs Lab 11/29/14 0543 11/30/14 1055 12/01/14 0628 12/01/14 2359 12/04/14 0622  AST 88* 84* 141* 163* 90*  ALT 238* 216* 214* 244* 189*  ALKPHOS 193* 193* 200* 224* 194*  BILITOT 0.8 0.8 0.7 0.9 0.8  PROT 5.7* 6.5 5.6* 5.6* 6.0  ALBUMIN 2.6* 2.9* 2.6* 2.7* 2.8*   No results for input(s): LIPASE, AMYLASE in the last 168 hours. No results for input(s): AMMONIA in the last 168 hours. CBC:  Recent Labs Lab 11/29/14 0543 12/01/14 0628 12/01/14 0906 12/04/14 0622  WBC 8.2 6.3 6.5  6.6  HGB 10.4* 9.9* 10.6* 10.2*  HCT 30.2* 29.0* 31.5* 30.0*  MCV 79.7 81.2 80.6 82.6  PLT 302 290 343 330   Cardiac Enzymes:   No results for input(s): CKTOTAL, CKMB, CKMBINDEX, TROPONINI in the last 168 hours. BNP (last 3 results) No results for input(s): BNP in the last 8760 hours.  ProBNP (last 3 results) No results for input(s): PROBNP in the last 8760 hours.  CBG: No results for input(s): GLUCAP in the last 168 hours.  No results found for this or any previous visit (from the past 240 hour(s)).   Studies: No results found.  Scheduled Meds: . cyanocobalamin  1,000 mcg Intramuscular q morning - 10a  . feeding  supplement (GLUCERNA SHAKE)  237 mL Oral TID BM  . feeding supplement (PRO-STAT SUGAR FREE 64)  30 mL Oral BID  . folic acid  1 mg Oral Daily  . heparin  5,000 Units Subcutaneous 3 times per day  . lactulose  20 g Oral TID  . losartan  50 mg Oral Daily  . metoprolol tartrate  25 mg Oral BID  . multivitamin  5 mL Oral Daily  . pantoprazole  40 mg Oral Daily  . sertraline  50 mg Oral Daily  . thiamine IV  100 mg Intravenous Daily  . zolpidem  5 mg Oral QHS    Continuous Infusions: . 0.9 % NaCl with KCl 20 mEq / L 75 mL/hr at 12/05/14 1132     Time spent: >735mins  Ryen Rhames MD, [facility] Pager [phone]. If 7PM-7AM, please contact night-coverage at [email or web address], password [email or web address] 12/05/2014, 1:18 PM  LOS: 9 days

## 2014-12-06 LAB — VITAMIN B1: Vitamin B1 (Thiamine): 91 nmol/L (ref 66.5–200.0)

## 2014-12-06 NOTE — Progress Notes (Addendum)
PROGRESS NOTE  [name] [name] MVH:[id number] DOB: 03-24-1976 DOA: 11/26/2014 PCP: No primary care provider on file.  HPI/Recap of past 24 hours: Weakness stable, Po intake remains poor  Assessment/Plan:  Elevation of LFT with Nausea -likely due to GB sludge, HIDA with low GBEF -GI and CCS signed off -no plans for Lap chole at this time, to be considered down the road once neurologically better -supportive care  -Diet education, Bariatric RN consult appreciated  Hypokalemia/hypomagnesemia:  -replace k, replace mag.  -Continue gentle hydration.  Confusion/Encephalopathy: weakness and sensory loss -seen by neurology -due to nutritional deficiencies -on Thiamine, changed to IV, continue IM cyanocobalamin -MRI brain no acute finding, ammonia level wnl.  -TSH/B12/folate wnl, rpr/hiv neg, thiamine level 91(on replacement), copper and Vitamin E pending too -She was started on b12/folic acid/thamine supplement on 4/3, now changed to IV -Vitamin E and copper levels pending -PT, ambulate  HTN: continue cozaar, hold hctz. increase lopressor due to persistent sinus tachycardia. TSH wnl. Hr better on lopressor, continue titrate  Obesity s/p gastric bypass. Reported 80pounds of weight loss since bypass procedure in 06/2014. Dietician consult.   Solid hepatic lesions (CT [facility] Feb 2016) Needs non urgent biopsy, FU with Outpatient GI  Code Status: full  Family Communication: patient   Disposition Plan: snf   Consultants:  GI Psych Surgery  Procedures:  HIDA/ab us/mri brain/spine  Antibiotics:  none   Objective: BP 154/101 mmHg  Pulse 107  Temp(Src) 98.7 F (37.1 C) (Oral)  Resp 16  Ht  (1.727 m)  Wt 75.297 kg (166 lb)  BMI 25.25 kg/m2  SpO2 99%  LMP  (LMP Unknown)  Intake/Output Summary (Last 24 hours) at 12/06/14 1201 Last data filed at 12/06/14 1000  Gross per 24 hour  Intake      0 ml  Output   2150 ml  Net  -2150 ml   Filed Weights   11/26/14  1755 12/06/14 0619  Weight: 74.208 kg (163 lb 9.6 oz) 75.297 kg (166 lb)    Exam:   General:  NAD, weak  Cardiovascular: RRR  Respiratory: CTAB  Abdomen: Soft/ND/NT, positive BS  Musculoskeletal: No Edema  Neuro:orienetd to self, place only not oriented to time  Poor short term memory. Generalized weakness, loss of pin prick  And paresthesias noted.  Data Reviewed: Basic Metabolic Panel:  Recent Labs Lab 11/30/14 1055 12/01/14 0628 12/01/14 2359 12/02/14 0014 12/04/14 0622  NA 137 139 137  --  140  K 3.6 3.7 3.8  --  3.9  CL 105 108 106  --  109  CO2 --  22  GLUCOSE 108* 99 90  --  91  BUN 5* 5* 8  --  9  CREATININE 0.79 0.61 0.67  --  0.64  CALCIUM 8.8 8.6 8.8  --  9.1  MG 2.1 1.8  --  1.9  --    Liver Function Tests:  Recent Labs Lab 11/30/14 1055 12/01/14 0628 12/01/14 2359 12/04/14 0622  AST 84* 141* 163* 90*  ALT 216* 214* 244* 189*  ALKPHOS 193* 200* 224* 194*  BILITOT 0.8 0.7 0.9 0.8  PROT 6.5 5.6* 5.6* 6.0  ALBUMIN 2.9* 2.6* 2.7* 2.8*   No results for input(s): LIPASE, AMYLASE in the last 168 hours. No results for input(s): AMMONIA in the last 168 hours. CBC:  Recent Labs Lab 12/01/14 0628 12/01/14 0906 12/04/14 0622  WBC 6.3 6.5 6.6  HGB 9.9* 10.6* 10.2*  HCT 29.0* 31.5* 30.0*  MCV 81.2 80.6 82.6  PLT 290 343 330   Cardiac Enzymes:   No results for input(s): CKTOTAL, CKMB, CKMBINDEX, TROPONINI in the last 168 hours. BNP (last 3 results) No results for input(s): BNP in the last 8760 hours.  ProBNP (last 3 results) No results for input(s): PROBNP in the last 8760 hours.  CBG: No results for input(s): GLUCAP in the last 168 hours.  No results found for this or any previous visit (from the past 240 hour(s)).   Studies: No results found.  Scheduled Meds: . feeding supplement (GLUCERNA SHAKE)  237 mL Oral TID BM  . feeding supplement (PRO-STAT SUGAR FREE 64)  30 mL Oral BID  . folic acid  1 mg Oral Daily  . heparin   5,000 Units Subcutaneous 3 times per day  . lactulose  20 g Oral TID  . losartan  50 mg Oral Daily  . metoprolol tartrate  25 mg Oral BID  . multivitamin  5 mL Oral Daily  . pantoprazole  40 mg Oral Daily  . sertraline  50 mg Oral Daily  . thiamine IV  100 mg Intravenous Daily  . zolpidem  5 mg Oral QHS    Continuous Infusions:     Time spent: >3935mins  Laresa Oshiro MD, [facility] Pager [phone]. If 7PM-7AM, please contact night-coverage at [email or web address], password [email or web address] 12/06/2014, 12:01 PM  LOS: 10 days

## 2014-12-07 LAB — VITAMIN E
ALPHA-TOCOPHEROL: 11.6 mg/L (ref 5.7–19.9)
Gamma-Tocopherol (Vit E): 0.8 mg/L (ref <=4.3)

## 2014-12-07 MED ORDER — DIPHENHYDRAMINE-ZINC ACETATE 2-0.1 % EX CREA
TOPICAL_CREAM | Freq: Three times a day (TID) | CUTANEOUS | Status: DC | PRN
  Administered 2014-12-07: 14:00:00 via TOPICAL
  Filled 2014-12-07: qty 28

## 2014-12-07 MED ORDER — VITAMIN D (ERGOCALCIFEROL) 1.25 MG (50000 UNIT) PO CAPS
50000.0000 [IU] | ORAL_CAPSULE | ORAL | Status: DC
  Administered 2014-12-07: 50000 [IU] via ORAL
  Filled 2014-12-07: qty 1

## 2014-12-07 MED ORDER — ENOXAPARIN SODIUM 40 MG/0.4ML ~~LOC~~ SOLN
40.0000 mg | SUBCUTANEOUS | Status: DC
Start: 2014-12-07 — End: 2014-12-08
  Administered 2014-12-07 – 2014-12-08 (×2): 40 mg via SUBCUTANEOUS
  Filled 2014-12-07 (×2): qty 0.4

## 2014-12-07 NOTE — Progress Notes (Signed)
PROGRESS NOTE  [name] [name] ZOX:[id number] DOB: 1976/02/25 DOA: 11/26/2014 PCP: No primary care provider on file.  HPI/Recap of past 24 hours: In better spirits today, weakness unchanged  Assessment/Plan:  Elevation of LFTs with Nausea -due to GB sludge most likely, HIDA with low GBEF -GI and CCS signed off -no plans for Lap chole at this time, to be considered down the road once neurologically better -supportive care  -Diet education, Bariatric RN Laurie's input appreciated  Hypokalemia/hypomagnesemia:  -replaced mag and K  Confusion/Encephalopathy: weakness and sensory loss -seen by neurology -due to nutritional deficiencies -on Thiamine, changed to IV, continue IM cyanocobalamin -MRI brain no acute finding, ammonia level wnl.  -TSH/B12/folate wnl, rpr/hiv neg, thiamine level 91(on replacement), copper and Vitamin E pending too -Vitamin D low, start replacement  -She was started on b12/folic acid/thamine supplement on 4/3, now changed to IV -Vitamin E and copper levels pending -PT, ambulate  HTN: continue cozaar, hold hctz. increase lopressor due to persistent sinus tachycardia. TSH wnl. -HR better on lopressor, continue titrate  Obesity s/p gastric bypass. Reported 80pounds of weight loss since bypass procedure in 06/2014. Dietician consult.   Solid hepatic lesions (CT [facility] Feb 2016) Needs non urgent biopsy, FU with Outpatient GI  Code Status: full  Family Communication: patient   Disposition Plan: snf   Consultants:  GI Psych Surgery  Procedures:  HIDA/ab us/mri brain/spine  Antibiotics:  none   Objective: BP 142/93 mmHg  Pulse 99  Temp(Src) 98.3 F (36.8 C) (Oral)  Resp 18  Ht  (1.727 m)  Wt 75.297 kg (166 lb)  BMI 25.25 kg/m2  SpO2 98%  LMP  (LMP Unknown)  Intake/Output Summary (Last 24 hours) at 12/07/14 1430 Last data filed at 12/07/14 1130  Gross per 24 hour  Intake      0 ml  Output   1200 ml  Net  -1200 ml   Filed  Weights   11/26/14 1755 12/06/14 0619  Weight: 74.208 kg (163 lb 9.6 oz) 75.297 kg (166 lb)    Exam:   General:  NAD, weak  Cardiovascular: RRR  Respiratory: CTAB  Abdomen: Soft/ND/NT, positive BS  Musculoskeletal: No Edema  Neuro:orienetd to self, place only not oriented to time  Poor short term memory. Generalized weakness, loss of pin prick  And paresthesias noted.  Data Reviewed: Basic Metabolic Panel:  Recent Labs Lab 12/01/14 0628 12/01/14 2359 12/02/14 0014 12/04/14 0622  NA 139 137  --  140  K 3.7 3.8  --  3.9  CL 108 106  --  109  CO2 23 26  --  22  GLUCOSE 99 90  --  91  BUN 5* 8  --  9  CREATININE 0.61 0.67  --  0.64  CALCIUM 8.6 8.8  --  9.1  MG 1.8  --  1.9  --    Liver Function Tests:  Recent Labs Lab 12/01/14 0628 12/01/14 2359 12/04/14 0622  AST 141* 163* 90*  ALT 214* 244* 189*  ALKPHOS 200* 224* 194*  BILITOT 0.7 0.9 0.8  PROT 5.6* 5.6* 6.0  ALBUMIN 2.6* 2.7* 2.8*   No results for input(s): LIPASE, AMYLASE in the last 168 hours. No results for input(s): AMMONIA in the last 168 hours. CBC:  Recent Labs Lab 12/01/14 0628 12/01/14 0906 12/04/14 0622  WBC 6.3 6.5 6.6  HGB 9.9* 10.6* 10.2*  HCT 29.0* 31.5* 30.0*  MCV 81.2 80.6 82.6  PLT 290 343 330   Cardiac Enzymes:  No results for input(s): CKTOTAL, CKMB, CKMBINDEX, TROPONINI in the last 168 hours. BNP (last 3 results) No results for input(s): BNP in the last 8760 hours.  ProBNP (last 3 results) No results for input(s): PROBNP in the last 8760 hours.  CBG: No results for input(s): GLUCAP in the last 168 hours.  No results found for this or any previous visit (from the past 240 hour(s)).   Studies: No results found.  Scheduled Meds: . enoxaparin (LOVENOX) injection  40 mg Subcutaneous Q24H  . feeding supplement (GLUCERNA SHAKE)  237 mL Oral TID BM  . feeding supplement (PRO-STAT SUGAR FREE 64)  30 mL Oral BID  . folic acid  1 mg Oral Daily  . lactulose  20 g Oral  TID  . losartan  50 mg Oral Daily  . metoprolol tartrate  25 mg Oral BID  . multivitamin  5 mL Oral Daily  . pantoprazole  40 mg Oral Daily  . sertraline  50 mg Oral Daily  . thiamine IV  100 mg Intravenous Daily  . Vitamin D (Ergocalciferol)  50,000 Units Oral Q7 days  . zolpidem  5 mg Oral QHS    Continuous Infusions:     Time spent: >4135mins  Duante Arocho MD, [facility] Pager [phone]. If 7PM-7AM, please contact night-coverage at [email or web address], password [email or web address] 12/07/2014, 2:30 PM  LOS: 11 days

## 2014-12-08 LAB — BASIC METABOLIC PANEL
ANION GAP: 13 (ref 5–15)
BUN: 10 mg/dL (ref 6–23)
CO2: 24 mmol/L (ref 19–32)
Calcium: 9.6 mg/dL (ref 8.4–10.5)
Chloride: 100 mmol/L (ref 96–112)
Creatinine, Ser: 0.8 mg/dL (ref 0.50–1.10)
GFR calc Af Amer: >90 mL/min (ref >90)
GFR calc non Af Amer: >90 mL/min (ref >90)
Glucose, Bld: 115 mg/dL — ABNORMAL HIGH (ref 70–99)
Potassium: 3.5 mmol/L (ref 3.5–5.1)
SODIUM: 137 mmol/L (ref 135–145)

## 2014-12-08 LAB — COPPER, SERUM: Copper: 94 ug/dL (ref 70–175)

## 2014-12-08 MED ORDER — ADULT MULTIVITAMIN LIQUID CH: 5.0000 mL | Freq: Every day | ORAL | Status: DC

## 2014-12-08 MED ORDER — FOLIC ACID 1 MG PO TABS: 1.0000 mg | ORAL_TABLET | Freq: Every day | ORAL | Status: DC

## 2014-12-08 MED ORDER — LOSARTAN POTASSIUM 50 MG PO TABS: 50.0000 mg | ORAL_TABLET | Freq: Every day | ORAL | Status: DC

## 2014-12-08 MED ORDER — CYANOCOBALAMIN 1000 MCG PO TABS: 1000.0000 ug | ORAL_TABLET | Freq: Every day | ORAL | Status: DC

## 2014-12-08 MED ORDER — VITAMIN D (ERGOCALCIFEROL) 1.25 MG (50000 UNIT) PO CAPS: 50000.0000 [IU] | ORAL_CAPSULE | ORAL | Status: DC

## 2014-12-08 MED ORDER — VITAMIN B-1 100 MG PO TABS: 100.0000 mg | ORAL_TABLET | Freq: Every day | ORAL | Status: DC

## 2014-12-08 MED ORDER — METOPROLOL TARTRATE 25 MG PO TABS: 25.0000 mg | ORAL_TABLET | Freq: Two times a day (BID) | ORAL | Status: DC

## 2014-12-08 MED ORDER — SERTRALINE HCL 50 MG PO TABS: 50.0000 mg | ORAL_TABLET | Freq: Every day | ORAL | Status: DC

## 2014-12-08 MED ORDER — VITAMIN B-12 1000 MCG PO TABS
1000.0000 ug | ORAL_TABLET | Freq: Every day | ORAL | Status: DC
  Administered 2014-12-08: 1000 ug via ORAL
  Filled 2014-12-08: qty 1

## 2014-12-08 MED ORDER — LACTULOSE 10 GM/15ML PO SOLN: 20.0000 g | Freq: Two times a day (BID) | ORAL | Status: DC

## 2014-12-08 MED ORDER — PANTOPRAZOLE SODIUM 40 MG PO TBEC: 40.0000 mg | DELAYED_RELEASE_TABLET | Freq: Every day | ORAL | Status: DC

## 2014-12-08 MED ORDER — GLUCERNA SHAKE PO LIQD: 237.0000 mL | Freq: Three times a day (TID) | ORAL | Status: DC

## 2014-12-08 MED ORDER — PRO-STAT SUGAR FREE PO LIQD: 30.0000 mL | Freq: Two times a day (BID) | ORAL | Status: DC

## 2014-12-08 NOTE — Progress Notes (Signed)
Physical Therapy Treatment Patient Details Name: Kizzie FantasiaChristel Frankie MRN: [id number] DOB: 01/31/1976 Today's Date: 12/08/2014    History of Present Illness Pt is a 39 y.o. Female PMH of HTN, obesity, and s/p gastric bypass in 07/2014 admitted 11/26/14 for nausea, elevated LFT, weakness and ataxia.     PT Comments    Pt progressing with mobility but continues to have HR elevation into 140's with ambulation, upper 130's with LE there ex, and 3+ mins for HR to return to low 100's. Of note, pt had not received Lopressor yet today. Pt requiring min A for ambulation with ataxic gait and distance limited by dizziness which occurs after only 15-20'. Pt continues to have decreased muscular control and coordinatoin R>L. PT will continue to follow.   Follow Up Recommendations  SNF     Equipment Recommendations  Rolling walker with 5" wheels    Recommendations for Other Services       Precautions / Restrictions Precautions Precautions: Fall Restrictions Weight Bearing Restrictions: No    Mobility  Bed Mobility               General bed mobility comments: pt received in recliner  Transfers Overall transfer level: Needs assistance Equipment used: Rolling walker (2 wheeled) Transfers: Sit to/from Stand Sit to Stand: Min guard;Mod assist         General transfer comment: min A for first stand from recliner, mod A fron low toilet, min-guard second time from recliner. vc's for hand position, especially with returning to sit.  Ambulation/Gait Ambulation/Gait assistance: Min assist Ambulation Distance (Feet): 40 Feet (20', seated rest, 20') Assistive device: Rolling walker (2 wheeled) Gait Pattern/deviations: Ataxic;Decreased step length - right;Decreased step length - left Gait velocity: slow Gait velocity interpretation: <1.8 ft/sec, indicative of risk for recurrent falls General Gait Details: pt continues with ataxic gait, reports that her feet feel "tingly" and gait pattern  consistent with this with "foot slap" pattern. Pt became dizzy after 15' of ambulation each time.    Stairs            Wheelchair Mobility    Modified Rankin (Stroke Patients Only)       Balance Overall balance assessment: Needs assistance Sitting-balance support: No upper extremity supported;Feet supported Sitting balance-Leahy Scale: Fair Sitting balance - Comments: decreased trunk control   Standing balance support: Single extremity supported;During functional activity Standing balance-Leahy Scale: Poor Standing balance comment: able to maintain standing with unilateral support, unsafe without UE support                    Cognition Arousal/Alertness: Awake/alert Behavior During Therapy: WFL for tasks assessed/performed Overall Cognitive Status: Impaired/Different from baseline Area of Impairment: Memory;Orientation Orientation Level: Time;Disoriented to   Memory: Decreased short-term memory         General Comments: pt does not know the day or month, can state that her daughter had a birthday but then does not add a year onto her previous age. Husband reports that she has imrpoved cognitively since admission but not back to baseline    Exercises General Exercises - Lower Extremity Ankle Circles/Pumps: AROM;Both;10 reps;Seated Quad Sets: AROM;Both;10 reps;Seated Long Arc Quad: AROM;Both;5 reps;Seated Straight Leg Raises: AROM;Both;10 reps;Seated (limited ROM due to weakness)    General Comments General comments (skin integrity, edema, etc.): RLE weaker than left with larger deficits in coordination. LLE: hip flex 4-/5, knee ext 3+/5, knee flex 3+/5, ankle df 3+/5, ankle pf 4/5. Right: hip flex 3+/5, knee ext 3-/5, knee  flex 3/5, ankle df 3/5, ankle pf 2/5      Pertinent Vitals/Pain Pain Assessment: No/denies pain  Ambulation HR 141 bpm O2 sats 98% on RA    Home Living                      Prior Function            PT Goals (current  goals can now be found in the care plan section) Acute Rehab PT Goals Patient Stated Goal: to go to rehab PT Goal Formulation: With patient Time For Goal Achievement: 12/11/14 Potential to Achieve Goals: Good Progress towards PT goals: Progressing toward goals    Frequency  Min 3X/week    PT Plan Current plan remains appropriate    Co-evaluation             End of Session Equipment Utilized During Treatment: Gait belt Activity Tolerance: Patient tolerated treatment well Patient left: in chair;with call bell/phone within reach;with family/visitor present     Time: 1610-9604 PT Time Calculation (min) (ACUTE ONLY): 33 min  Charges:  $Gait Training: 8-22 mins $Therapeutic Activity: 8-22 mins                    G Codes:     Lyanne Co, PT  Acute Rehab Services  [phone]  Lyanne Co 12/08/2014, 11:55 AM

## 2014-12-08 NOTE — Discharge Summary (Signed)
Physician Discharge Summary  [name] [name] NWG:[id number] DOB: [birth year] DOA: 11/26/2014  PCP: No primary care provider on file.  Admit date: 11/26/2014 Discharge date: 12/08/2014  Time spent: 45 minutes  Recommendations for Outpatient Follow-up:  1. Dr.Bray GI, needs further workup of liver lesions noted on CT 2. Dr.James Dasher in 2 weeks 3. Bariatric Dietician to follow closely 4. General Surgery for biliary sludge to eval for Lap chole down the road 5. Please FU COpper level  Discharge Diagnoses:    Ataxia Encephalopathy  peripheral neuropathy  Nutritional deficiencies   Adjustment disorder with mixed anxiety and depressed mood   Ataxia   Elevated LFTs   Obesity   Anxiety   Biliary Dyskinesia   FTT (failure to thrive) in adult   Confusion   Essential hypertension   Sinus tachycardia   Leg weakness   Discharge Condition: stable  Diet recommendation: Bariatric diet with supplementation  Filed Weights   11/26/14 1755 12/06/14 0619  Weight: 74.208 kg (163 lb 9.6 oz) 75.297 kg (166 lb)    History of present illness:  With a hx of morbid obesity s/p gastric sleeve in 11/15, HTN, who presented with progressive nausea and weakness. In the ED, pt noted to have difficulty ambulating. She was noted to have increased LFT's: alk phos of 223, alt 520, and ast 223 with bili of 1.8.    Hospital Course:  Elevation of LFTs with Nausea -due to GB sludge most likely, HIDA with low GBEF -GI and CCS signed off -no plans for Lap chole at this time, to be considered down the road once neurologically better -supportive care  -Diet education, Bariatric RN Laurie's input appreciated  Hypokalemia/hypomagnesemia:  -replaced mag and K  Confusion/Encephalopathy: weakness and sensory loss -seen by neurology felt to be due to nutritional deficiencies -Was started on IV Thiamine and IM cyanocobalamin with some improvement in motor strength and cognition -MRI brain no acute finding, MRI C  spine normal as well -TSH/B12/folate wnl, rpr/hiv neg, thiamine level 91(on replacement), these levels were drawn after she was started on replacement and hence could have affected the results - copper level pending -Vitamin E normal -Vitamin D low, started replacement  -Vitamin E and copper levels pending -PT, ambulate  HTN: continue cozaar, stopped hctz due to low K -HR better on lopressor, and cozaar  Obesity s/p gastric bypass. Reported 80pounds of weight loss since bypass procedure in 06/2014.  -seen by Bariatric dietician, needs close FU with RD   Solid hepatic lesions (CT [facility] Feb 2016) Needs non urgent biopsy, FU with Outpatient GI, has appt for 4/26  Procedures:  HIDA: low gall bladder EF  Consultations:  Surgery/CCS  Gi  Neurology  Discharge Exam: Filed Vitals:   12/08/14 0504  BP: 150/103  Pulse: 97  Temp: 98.7 F (37.1 C)  Resp: 17    General: AAOx3 Cardiovascular: S1S2/RRR Respiratory: CTAB  Discharge Instructions   Discharge Instructions    DME Other see comment    Complete by:  As directed   Rolling walker x1     Discharge instructions    Complete by:  As directed   Bariatric Diet, eat 6 small meals daily     Increase activity slowly    Complete by:  As directed           Current Discharge Medication List    START taking these medications   Details  Amino Acids-Protein Hydrolys (FEEDING SUPPLEMENT, PRO-STAT SUGAR FREE 64,) LIQD Take 30 mLs by mouth 2 (  two) times daily. Refills: 0    feeding supplement, GLUCERNA SHAKE, (GLUCERNA SHAKE) LIQD Take 237 mLs by mouth 3 (three) times daily between meals. Refills: 0    folic acid (FOLVITE) 1 MG tablet Take 1 tablet (1 mg total) by mouth daily.    lactulose (CHRONULAC) 10 GM/15ML solution Take 30 mLs (20 g total) by mouth 2 (two) times daily. Refills: 0    losartan (COZAAR) 50 MG tablet Take 1 tablet (50 mg total) by mouth daily. Refills: 0    metoprolol tartrate (LOPRESSOR) 25 MG  tablet Take 1 tablet (25 mg total) by mouth 2 (two) times daily.    Multiple Vitamin (MULTIVITAMIN) LIQD Take 5 mLs by mouth daily.    pantoprazole (PROTONIX) 40 MG tablet Take 1 tablet (40 mg total) by mouth daily.    sertraline (ZOLOFT) 50 MG tablet Take 1 tablet (50 mg total) by mouth daily.    thiamine (VITAMIN B-1) 100 MG tablet Take 1 tablet (100 mg total) by mouth daily.    vitamin B-12 1000 MCG tablet Take 1 tablet (1,000 mcg total) by mouth daily.    Vitamin D, Ergocalciferol, (DRISDOL) 50000 UNITS CAPS capsule Take 1 capsule (50,000 Units total) by mouth every 7 (seven) days. For 8 weeks Qty: 30 capsule      CONTINUE these medications which have NOT CHANGED   Details  ondansetron (ZOFRAN) 8 MG tablet Take 1 tablet (8 mg total) by mouth every 8 (eight) hours as needed for nausea. Qty: 10 tablet, Refills: 0    potassium chloride SA (K-DUR,KLOR-CON) 20 MEQ tablet Take 1 tablet (20 mEq total) by mouth 2 (two) times daily. Qty: 6 tablet, Refills: 0      STOP taking these medications     losartan-hydrochlorothiazide (HYZAAR) 50-12.5 MG per tablet        Allergies  Allergen Reactions  . Levaquin [Levofloxacin In D5w] Rash  . Penicillins Itching and Rash    Rash on lips/itch   Follow-up Information    Follow up with [facility]. Schedule an appointment as soon as possible for a visit today.   Why:  Social worker in emergency room called on your behalf.  Patient must be the one to schedule appointment.  They are aware of need, per referral from [facility] ED, but need you to call.    Contact information:   [address] [place], [place] [address] [phone]       Follow up with Rosana Berger, MD On 12/23/2014.   Specialty:  Gastroenterology   Why:  1 PM.  appointment is with PA for Dr Glennon Hamilton, Jacqlyn Krauss.  this is to follow up your liver test abnormalities.    Contact information:   [facility] [address] [place], [place] [address] [phone]        The results of significant diagnostics from this hospitalization (including imaging, microbiology, ancillary and laboratory) are listed below for reference.    Significant Diagnostic Studies: Mr Brain Wo Contrast  11/23/2014   CLINICAL DATA:  Weakness and nausea of several months duration.  EXAM: MRI HEAD WITHOUT CONTRAST  TECHNIQUE: Multiplanar, multiecho pulse sequences of the brain and surrounding structures were obtained without intravenous contrast.  COMPARISON:  None.  FINDINGS: Diffusion imaging does not show any acute or subacute infarction. The brainstem and cerebellum are normal. The cerebral hemispheres are normal except for a few punctate foci of T2 and FLAIR signal in the subcortical white matter,  not likely significant. No cortical or large vessel territory abnormality. No mass lesion, hemorrhage, hydrocephalus or extra-axial collection. No pituitary mass. No inflammatory sinus disease. No skull or skullbase lesion. Major vessels at the base of the brain show flow.  IMPRESSION: No significant finding. No cause of the presenting symptoms is identified. Normal study except for a few punctate white matter foci not likely of clinical relevance.   Electronically Signed   By: Nelson Chimes M.D.   On: 11/23/2014 19:08   Mr Cervical Spine Wo Contrast  12/03/2014   CLINICAL DATA:  Posterior column changes with weakness and sensory loss beginning 1-2 months ago. Weakness disproportionate to sensory loss suspicious for a combined of myelopathic/neuropathic process. Status post gastric sleeve November 2015.  EXAM: MRI CERVICAL SPINE WITHOUT CONTRAST  TECHNIQUE: Multiplanar, multisequence MR imaging of the cervical spine was performed. No intravenous contrast was administered.  COMPARISON:  MRI of the brain November 23, 2014  FINDINGS: Cervical vertebral bodies and posterior elements are intact and aligned with mild broad reversed cervical  lordosis. Intervertebral discs demonstrate normal morphology and signal characteristics. Mild chronic discogenic endplate change at U2-3, no STIR signal abnormality to suggest acute osseous process.  Cervical spinal cord appears normal morphology and signal characteristics from the cervical medullary junction to level of T3-4, the most caudal well visualized level. Faint STIR signal in the RIGHT posterior column at the level of C2-3, possibly present on the axial T2 is limited by moderate motion the axial T2 sequences. No syrinx or myelomalacia. No susceptibility artifact to suggest blood products. Craniocervical junction is maintained. Included prevertebral and paraspinal soft tissues are normal.  Level by level evaluation:  C2-3 and C3-4: No disc bulge, canal stenosis nor neural foraminal narrowing.  C4-5 and C5-6: Minimal annular bulging without canal stenosis or neural foraminal narrowing.  C6 C6-7 and C7-T1: No disc bulge, canal stenosis nor neural foraminal narrowing.  IMPRESSION: Motion degraded examination without convincing evidence of this cervical spinal cord signal abnormality (faint T2 hyperintense signal at C2-3 within the dorsal spinal column is likely motion artifact). Though none is suspected, contrast-enhanced sequences could be obtained if clinical concern for acute inflammatory changes.  Broad reversed cervical lordosis without acute fracture, malalignment nor neurocompressive changes.   Electronically Signed   By: Elon Alas   On: 12/03/2014 23:00   Mr Lumbar Spine Wo Contrast  11/26/2014   CLINICAL DATA:  Bilateral leg weakness.  Weakness for 3 months.  EXAM: MRI LUMBAR SPINE WITHOUT CONTRAST  TECHNIQUE: Multiplanar, multisequence MR imaging of the lumbar spine was performed. No intravenous contrast was administered.  COMPARISON:  None.  FINDINGS: The vertebral bodies of the lumbar spine are normal in size. The vertebral bodies of the lumbar spine are normal in alignment. There is  normal bone marrow signal demonstrated throughout the vertebra. The intervertebral disc spaces are well-maintained.  The spinal cord is normal in signal and contour. The cord terminates normally at L1 . The nerve roots of the cauda equina and the filum terminale are normal.  The visualized portions of the SI joints are unremarkable.  10 mm T2 hyperintense and T1 hypointense right renal mass statistically likely represent a cyst.  T12-L1: No significant disc bulge. No evidence of neural foraminal stenosis. No central canal stenosis.  L1-L2: No significant disc bulge. No evidence of neural foraminal stenosis. No central canal stenosis.  L2-L3: No significant disc bulge. No evidence of neural foraminal stenosis. No central canal stenosis.  L3-L4: No significant disc  bulge. No evidence of neural foraminal stenosis. No central canal stenosis.  L4-L5: No significant disc bulge. No evidence of neural foraminal stenosis. No central canal stenosis.  L5-S1: No significant disc bulge. No evidence of neural foraminal stenosis. No central canal stenosis. Mild bilateral facet arthropathy.  IMPRESSION: 1. No significant lumbar spine disc protrusion, foraminal stenosis or central canal stenosis. 2. Mild bilateral facet arthropathy at L5-S1.   Electronically Signed   By: Kathreen Devoid   On: 11/26/2014 16:02   Nm Hepatobiliary Liver Func  11/27/2014   CLINICAL DATA:  Gallstones.  EXAM: NUCLEAR MEDICINE HEPATOBILIARY IMAGING WITH GALLBLADDER EF  TECHNIQUE: Sequential images of the abdomen were obtained out to 60 minutes following intravenous administration of radiopharmaceutical. After slow intravenous infusion of 1.48 micrograms Cholecystokinin, gallbladder ejection fraction was determined.  RADIOPHARMACEUTICALS:  5.0 Millicurie VP-71G Choletec  COMPARISON:  Ultrasound 11/27/2014.  FINDINGS: Liver, biliary system, gallbladder, bowel visualize normally. Gallbladder ejection fraction at 45 minutes is 16%. This is abnormal . At 45  min, normal ejection fraction is greater than 40%. Patient experienced no symptoms following administration of CCK.  IMPRESSION: 1. Liver, biliary system, gallbladder, bowel visualize normally. 2. Gallbladder ejection fraction is low at 16% at 45 minutes.   Electronically Signed   By: Marcello Moores  Register   On: 11/27/2014 15:42   US Abdomen Complete  11/27/2014   CLINICAL DATA:  Initial evaluation for elevated liver function tests with nausea and constipation  EXAM: ULTRASOUND ABDOMEN COMPLETE  COMPARISON:  None.  FINDINGS: Gallbladder: There is an extremely large volume of sludge within the lumen of the gallbladder. There may also be numerous small calculi. Gallbladder wall thickness is within normal limits. There is no sonographic Murphy sign.  Common bile duct: Diameter: 3 mm  Liver: No focal lesion identified. Within normal limits in parenchymal echogenicity.  IVC: No abnormality visualized.  Pancreas: Suboptimally characterize but grossly negative  Spleen: Size and appearance within normal limits.  Right Kidney: Length: 11 cm. 15 mm cyst midpole laterally. Otherwise negative.  Left Kidney: Length: 11 cm. Echogenicity within normal limits. No mass or hydronephrosis visualized.  Abdominal aorta: No aneurysm visualized.  Other findings: None.  IMPRESSION: Large volume of sludge within the gallbladder lumen possibly with tiny calculi associated. No gallbladder wall thickening or sonographic Murphy's sign.   Electronically Signed   By: Skipper Cliche M.D.   On: 11/27/2014 12:15   Dg Abd Acute W/chest  11/26/2014   CLINICAL DATA:  Generalized weakness.  EXAM: ACUTE ABDOMEN SERIES (ABDOMEN 2 VIEW & CHEST 1 VIEW)  COMPARISON:  None.  FINDINGS: There is no evidence of dilated bowel loops or free intraperitoneal air. No radiopaque calculi or other significant radiographic abnormality is seen. Heart size and mediastinal contours are within normal limits. Both lungs are clear.  IMPRESSION: Negative abdominal  radiographs.  No acute cardiopulmonary disease.   Electronically Signed   By: Lorriane Shire M.D.   On: 11/26/2014 11:35    Microbiology: No results found for this or any previous visit (from the past 240 hour(s)).   Labs: Basic Metabolic Panel:  Recent Labs Lab 12/01/14 2359 12/02/14 0014 12/04/14 0622  NA 137  --  140  K 3.8  --  3.9  CL 106  --  109  CO2 26  --  22  GLUCOSE 90  --  91  BUN 8  --  9  CREATININE 0.67  --  0.64  CALCIUM 8.8  --  9.1  MG  --  1.9  --    Liver Function Tests:  Recent Labs Lab 12/01/14 2359 12/04/14 0622  AST 163* 90*  ALT 244* 189*  ALKPHOS 224* 194*  BILITOT 0.9 0.8  PROT 5.6* 6.0  ALBUMIN 2.7* 2.8*   No results for input(s): LIPASE, AMYLASE in the last 168 hours. No results for input(s): AMMONIA in the last 168 hours. CBC:  Recent Labs Lab 12/04/14 0622  WBC 6.6  HGB 10.2*  HCT 30.0*  MCV 82.6  PLT 330   Cardiac Enzymes: No results for input(s): CKTOTAL, CKMB, CKMBINDEX, TROPONINI in the last 168 hours. BNP: BNP (last 3 results) No results for input(s): BNP in the last 8760 hours.  ProBNP (last 3 results) No results for input(s): PROBNP in the last 8760 hours.  CBG: No results for input(s): GLUCAP in the last 168 hours.     SignedDomenic Polite  [facility] 12/08/2014, 11:08 AM

## 2014-12-08 NOTE — Clinical Social Work Placement (Signed)
Clinical Social Work Department CLINICAL SOCIAL WORK PLACEMENT NOTE 11/28/2014  Patient: [name] [name],[name] [name] Account Number: [id number] Admit date: 11/26/2014  Clinical Social Worker: Lavell LusterJOSEPH BRYANT Jeaneane Adamec, LCSWA Date/time: 11/28/2014 02:28 PM  Clinical Social Work is seeking post-discharge placement for this patient at the following level of care: SKILLED NURSING (*CSW will update this form in Epic as items are completed)   11/28/2014 Patient/family provided with [facility] Department of Clinical Social Work's list of facilities offering this level of care within the geographic area requested by the patient (or if unable, by the patient's family).  11/28/2014 Patient/family informed of their freedom to choose among providers that offer the needed level of care, that participate in Medicare, Medicaid or managed care program needed by the patient, have an available bed and are willing to accept the patient.  11/28/2014 Patient/family informed of [facility]' ownership interest in [facility], as well as of the fact that they are under no obligation to receive care at this facility.  PASARR submitted to EDS on 11/28/2014 PASARR number received on 11/28/2014  FL2 transmitted to all facilities in geographic area requested by pt/family on 11/28/2014 FL2 transmitted to all facilities within larger geographic area on   Patient informed that his/her managed care company has contracts with or will negotiate with certain facilities, including the following:    Patient/family informed of bed offers received: 12/01/14 [facility] Physician recommends and patient chooses bed at   [facility] 12[facility]14  Patient to be transferred to facility by Ambulance Patient and family notified of transfer on  12/01/14 Name of family member notified: Andric (at bedsideO  The following physician request were entered in  Epic:   Additional Comments:   Per MD patient ready for DC [facility]. RN, patient, patient's family, and facility notified of DC. RN given number for report. DC packet on chart. Ambulance transport requested for patient for 3:30PM, RN will notify husband once patient has discharged. CSW signing off.    Roddie McBryant Aelyn Stanaland MSW, [place]WA, [place], [phone]

## 2014-12-08 NOTE — Progress Notes (Signed)
Patient out of facility via [facility] to SNF at this time with all belongings. Patient's home med of losartan/hctz 13 tablets given to EMT K. Cafe. Husband notified of patient departure from facility per wife request.

## 2015-06-10 ENCOUNTER — Ambulatory Visit: Payer: BLUE CROSS/BLUE SHIELD

## 2015-06-10 ENCOUNTER — Ambulatory Visit: Payer: BLUE CROSS/BLUE SHIELD | Attending: Family Medicine | Admitting: Rehabilitation

## 2015-06-10 DIAGNOSIS — R2681 Unsteadiness on feet: Secondary | ICD-10-CM | POA: Insufficient documentation

## 2015-06-10 DIAGNOSIS — R531 Weakness: Secondary | ICD-10-CM | POA: Insufficient documentation

## 2015-06-10 DIAGNOSIS — R41841 Cognitive communication deficit: Secondary | ICD-10-CM

## 2015-06-10 NOTE — Therapy (Signed)
[facility] [address] [place], [place], [address] Phone: [phone]   Fax:  [phone]  Speech Language Pathology Evaluation  Patient Details  Name: [name] [name] MRN: [id number] Date of Birth: November 25, 1975 Referring Provider:  Willow Ora, MD  Encounter Date: 06/10/2015      End of Session - 06/10/15 1730    Visit Number 1   Number of Visits 17   Date for SLP Re-Evaluation 08/10/15   SLP Start Time 1317   SLP Stop Time  1401   SLP Time Calculation (min) 44 min   Activity Tolerance Patient tolerated treatment well      Past Medical History  Diagnosis Date  . Hypertension   . ARF (acute renal failure) 09/2014    admitted with electrolyte disturbance, ARF to [facility].   Marland Kitchen Positive H. pylori test 06/2014    serum H pylori + and treated.   . 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 Laparoscopic gastric sleeve resection  06/2014    Dr Olin Pia of [facility]  . Endometrial ablation w/ novasure    . Tubal ligation  2002  . Esophagogastroduodenoscopy  2016    Dr Mariana Kaufman of [facility].  found gastritis, erosion at bypass surgery site.     There were no vitals filed for this visit.  Visit Diagnosis: Cognitive communication deficit      Subjective Assessment - 06/10/15 1336    Subjective "I want my memory to improve."   Patient is accompained by: Doralee Albino, husband            SLP Evaluation [facility] - 06/10/15 1338    SLP Visit Information   SLP Received On 06/14/15   Onset Date Early 2016   General Information   Other Pertinent Information Memory issues began after bariatric surgery in November 2015. Pt assumedly became malnourished.   Prior Functional Status   Cognitive/Linguistic Baseline Within functional limits   Type of Home House    Lives With Spouse   Vocation Full time employment   Cognition    Overall Cognitive Status Impaired/Different from baseline   Area of Impairment Memory   Auditory Comprehension   Overall Auditory Comprehension Appears within functional limits for tasks assessed   Verbal Expression   Overall Verbal Expression Appears within functional limits for tasks assessed   Oral Motor/Sensory Function   Overall Oral Motor/Sensory Function Appears within functional limits for tasks assessed   Motor Speech   Overall Motor Speech Appears within functional limits for tasks assessed   Standardized Assessments   Standardized Assessments  Other Assessment  Hopkins Verbal Learning Test   Other Assessment recall portion 5/12 (WNL), 6/12, 5/12 (both outside WNL);  recognition portion 11/12 (outside WNL)                         SLP Education - 06/10/15 1729    Education provided Yes   Education Details therapy course, journal section of memory notebook, procedural memory most salient memory technique   Person(s) Educated Patient;Spouse   Methods Explanation;Verbal cues   Comprehension Verbalized understanding;Verbal cues required;Need further instruction          SLP Short Term Goals - 06/10/15 1732    SLP SHORT TERM GOAL #1   Title pt to record average 5 events daily in memory notebook, 4 days a  week   Time 4   Period Weeks   Status New   SLP SHORT TERM GOAL #2   Title pt to recall pertinent information from therapy session after 10 minute delay using compensations except writing   Time 4   Period Weeks   Status New          SLP Long Term Goals - 06/10/15 1736    SLP LONG TERM GOAL #1   Title pt to record average 5 events in memory notebook/device at least 4 days per week 75% of opportunities (weeks)   Time 8   Period Weeks   Status New   SLP LONG TERM GOAL #2   Title pt will recall pertinent information after 15 minutes during therapy sessions using compensations (except writing)   Time 8   Period Weeks   Status New           Plan - 06/10/15 1731    Clinical Impression Statement Pt presents with cognitive linguistic deficits in the area of memory. Skilled ST needed to assist patient in improving/copmensating for memory skills in order to reduce caregiver burden and possible return to work.   Speech Therapy Frequency 2x / week   Duration --  8 weeks   Treatment/Interventions Compensatory strategies;Internal/external aids;Cognitive reorganization;SLP instruction and feedback;Patient/family education;Functional tasks   Potential to Achieve Goals Fair   Potential Considerations Severity of impairments   Consulted and Agree with Plan of Care Patient;Family member/caregiver   Family Member Consulted husband        Problem List Patient Active Problem List   Diagnosis Date Noted  . Calculus of gallbladder with acute on chronic cholecystitis without obstruction   . Leg weakness   . Adjustment disorder with mixed anxiety and depressed mood 11/30/2014  . FTT (failure to thrive) in adult   . Confusion   . Essential hypertension   . Sinus tachycardia (HCC)   . 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 Elevated LFTs 11/26/2014  . Obesity 11/26/2014  . Anxiety 11/26/2014  . Cholelithiasis 11/26/2014    SCHINKE,CARL , MS, CCC-SLP  06/10/2015, 5:39 PM  [facility] [address] [place], [place], [address] Phone: [phone]   Fax:  [phone]

## 2015-06-10 NOTE — Patient Instructions (Addendum)
Abduction: Clam (Eccentric) - Side-Lying    Lie on side with knees bent. Lift top knee, keeping feet together. Keep trunk steady, try not to let hips rotate forwards/backwards. Slowly lower for 3-5 seconds. _10__ reps per set, _1__ sets per day, __5_ days per week.  http://ecce.exer.us/65   Copyright  VHI. All rights reserved.   Chair Pose    Stand with legs hip-width apart. Inhaling, bend knees trying to keep knees over ankles, as if to sit on a chair and extend arms in front, wrists slightly lower than shoulders. Hold position for _2__ breaths. Exhaling, stand up. Repeat _10__ times. Do _2__ times per day.    DEVELOPMENTAL POSITION: Quadruped Alternate Shoulder Flexion / Hip Extension    Maintain trunk steady, raise arm and opposite leg at same time. __10_ reps per set, _2__ sets per day, _5__ days per week   Copyright  VHI. All rights reserved.

## 2015-06-10 NOTE — Patient Instructions (Signed)
Begin to keep a journal of daily happenings in your notebook, to look back on and recall things that have happened, as well as tell Andric things as necessary.

## 2015-06-10 NOTE — Therapy (Signed)
[facility] [address] [place], [place], [address] Phone: [phone]   Fax:  [phone]  Physical Therapy Evaluation  Patient Details  Name: [name] [name] MRN: [id number] Date of Birth: 05/09/1976 Referring Provider:  Willow Ora, MD  Encounter Date: 06/10/2015      PT End of Session - 06/10/15 1628    Visit Number 1   Number of Visits 9   Date for PT Re-Evaluation 08/09/15   Authorization Type BCBS   Authorization Time Period 60 combined visits   PT Start Time 1400   PT Stop Time 1445   PT Time Calculation (min) 45 min   Activity Tolerance Patient tolerated treatment well   Behavior During Therapy [facility] for tasks assessed/performed      Past Medical History  Diagnosis Date  . Hypertension   . ARF (acute renal failure) 09/2014    admitted with electrolyte disturbance, ARF to [facility].   Marland Kitchen Positive H. pylori test 06/2014    serum H pylori + and treated.   . 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 Laparoscopic gastric sleeve resection  06/2014    Dr Olin Pia of [facility]  . Endometrial ablation w/ novasure    . Tubal ligation  2002  . Esophagogastroduodenoscopy  2016    Dr Mariana Kaufman of [facility].  found gastritis, erosion at bypass surgery site.     There were no vitals filed for this visit.  Visit Diagnosis:  Generalized weakness - Plan: PT plan of care cert/re-cert  Unsteadiness - Plan: PT plan of care cert/re-cert      Subjective Assessment - 06/10/15 1409    Subjective "I am having trouble getting around, trouble with my balance."  Husband reports that since having surgery for weight loss and when pt down to a certain weight, they started to notice memory and weakness issues.  They feel that around March when hospitalized issues began due to high weight loss in short period of time.    Patient is  accompained by: Family member   Pertinent History gastic bypass, anxiety    Limitations Sitting;Walking   Currently in Pain? No/denies            [facility] PT Assessment - 06/10/15 1414    Assessment   Medical Diagnosis Generalized weakness   Onset Date/Surgical Date 10/28/14   Precautions   Precautions Fall   Restrictions   Weight Bearing Restrictions No   Balance Screen   Has the patient fallen in the past 6 months No   Has the patient had a decrease in activity level because of a fear of falling?  No   Is the patient reluctant to leave their home because of a fear of falling?  No   Home Environment   Living Environment Private residence   Living Arrangements Spouse/significant other;Children  daughter in 9th grade   Available Help at Discharge Family;Available PRN/intermittently   Type of Home House   Home Access Stairs to enter   Entrance Stairs-Number of Steps 1 (stoop)   Entrance Stairs-Rails None   Home Layout One level   Home Equipment None  has tub/shower   Prior Function   Level of Independence Needs assistance with homemaking  husband and daughter assist with homemaking, cooking   Vocation Requirements Used to work full time, was sit down desk job.    Cognition  Overall Cognitive Status Impaired/Different from baseline   Area of Impairment Memory   Observation/Other Assessments   Focus on Therapeutic Outcomes (FOTO)  Fear avoidance belief about physical activity: 53   Sensation   Light Touch Impaired Detail   Light Touch Impaired Details --  tingling sensation in hands and feet   Proprioception Appears Intact   Coordination   Gross Motor Movements are Fluid and Coordinated Yes   Fine Motor Movements are Fluid and Coordinated Yes   Heel Shin Test WFL   ROM / Strength   AROM / PROM / Strength Strength   Strength   Overall Strength Deficits   Overall Strength Comments overall WFL, R and LLE hip flex 4-/5, R LE knee flex 4/5   Transfers   Transfers Sit to  Stand;Stand to Sit   Sit to Stand 6: Modified independent (Device/Increase time)   Stand to Sit 6: Modified independent (Device/Increase time)   Ambulation/Gait   Ambulation/Gait Yes   Ambulation/Gait Assistance 6: Modified independent (Device/Increase time)   Ambulation Distance (Feet) 230 Feet   Assistive device None   Gait Pattern Step-through pattern;Decreased arm swing - right;Decreased arm swing - left;Decreased stride length;Right foot flat;Left foot flat;Trunk flexed   Ambulation Surface Level;Indoor   Gait velocity 2.97 ft/sec   Stairs Yes   Stairs Assistance 6: Modified independent (Device/Increase time)   Stair Management Technique One rail Right;Alternating pattern;Forwards   Number of Stairs 4   Height of Stairs 6   Standardized Balance Assessment   Standardized Balance Assessment Dynamic Gait Index   Dynamic Gait Index   Level Surface Mild Impairment   Change in Gait Speed Mild Impairment   Gait with Horizontal Head Turns Normal   Gait with Vertical Head Turns Normal   Gait and Pivot Turn Normal   Step Over Obstacle Mild Impairment   Step Around Obstacles Normal   Steps Mild Impairment   Total Score 20   Functional Gait  Assessment   Gait assessed  Yes   Gait Level Surface Walks 20 ft, slow speed, abnormal gait pattern, evidence for imbalance or deviates 10-15 in outside of the 12 in walkway width. Requires more than 7 sec to ambulate 20 ft.   Change in Gait Speed Able to change speed, demonstrates mild gait deviations, deviates 6-10 in outside of the 12 in walkway width, or no gait deviations, unable to achieve a major change in velocity, or uses a change in velocity, or uses an assistive device.   Gait with Horizontal Head Turns Performs head turns smoothly with no change in gait. Deviates no more than 6 in outside 12 in walkway width   Gait with Vertical Head Turns Performs head turns with no change in gait. Deviates no more than 6 in outside 12 in walkway width.    Gait and Pivot Turn Pivot turns safely within 3 sec and stops quickly with no loss of balance.   Step Over Obstacle Is able to step over one shoe box (4.5 in total height) without changing gait speed. No evidence of imbalance.   Gait with Narrow Base of Support Ambulates 7-9 steps.   Gait with Eyes Closed Walks 20 ft, uses assistive device, slower speed, mild gait deviations, deviates 6-10 in outside 12 in walkway width. Ambulates 20 ft in less than 9 sec but greater than 7 sec.   Ambulating Backwards Walks 20 ft, uses assistive device, slower speed, mild gait deviations, deviates 6-10 in outside 12 in walkway width.   Steps  Alternating feet, must use rail.   Total Score 22   FGA comment: 19-24 = medium risk fall                           PT Education - 06/10/15 1627    Education provided Yes   Education Details evaluation findings, POC, and HEP for strengthening   Person(s) Educated Patient;Spouse   Methods Explanation;Demonstration;Verbal cues;Handout   Comprehension Verbalized understanding;Returned demonstration             PT Long Term Goals - 06/10/15 1637    PT LONG TERM GOAL #1   Title Pt will be independent with HEP for strengthening and balance to decrease fall risk and increase functional strength.  (Target Date: 07/08/15)   Time 4   Period Weeks   PT LONG TERM GOAL #2   Title Pt will increase FGA score to 29/30 in order to indicate improvement in functional balance and score within age matched normals.   (Target Date: 07/08/15)   Time 4   Period Weeks   PT LONG TERM GOAL #3   Title Pt will ambulate >1000' on outdoor surfaces (paved, grass, gravel, curb, ramp, etc) without AD at independent level to indicate safe negotiation in community.  (Target Date: 07/08/15)   Time 4   Period Weeks   PT LONG TERM GOAL #4   Title Pt will increase gait speed to 3.57 ft/sec to increase efficiency of gait to more normal walking speed of 4.37 ft/sec.   (Target Date:  07/08/15)               Plan - 06/10/15 1630    Clinical Impression Statement Pt presents with generalized weakness in BLEs following period of decreased nutrition following gastic bypass surgery.  Pt had bypass in 06/2014 and was hospitalized in March 2016 due to malnourishment and high weight loss in short period of time.     Pt will benefit from skilled therapeutic intervention in order to improve on the following deficits Decreased activity tolerance;Decreased balance;Decreased endurance;Decreased strength;Impaired perceived functional ability;Postural dysfunction;Improper body mechanics   Rehab Potential Excellent   PT Frequency 2x / week   PT Duration 4 weeks   PT Treatment/Interventions Gait training;Stair training;Functional mobility training;Therapeutic activities;Therapeutic exercise;Balance training;Neuromuscular re-education;Patient/family education   PT Next Visit Plan assess compliance with HEP, add dumbell exercises, higher level strengthening with low weight more reps, balance exercises with head turns, compliant surfaces   PT Home Exercise Plan see pt instruction   Consulted and Agree with Plan of Care Patient;Family member/caregiver   Family Member Consulted Husband         Problem List Patient Active Problem List   Diagnosis Date Noted  . Calculus of gallbladder with acute on chronic cholecystitis without obstruction   . Leg weakness   . Adjustment disorder with mixed anxiety and depressed mood 11/30/2014  . FTT (failure to thrive) in adult   . Confusion   . Essential hypertension   . Sinus tachycardia (HCC)   . 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 Elevated LFTs 11/26/2014  . Obesity 11/26/2014  . Anxiety 11/26/2014  . Cholelithiasis 11/26/2014   Harriet Butte, PT, MPT [facility] [address] [place], [place], [address] Phone: [phone]   Fax:  [phone] 06/10/2015, 4:52 PM

## 2015-06-15 ENCOUNTER — Ambulatory Visit: Payer: BLUE CROSS/BLUE SHIELD | Admitting: Physical Therapy

## 2015-06-15 ENCOUNTER — Encounter: Payer: Self-pay | Admitting: Physical Therapy

## 2015-06-15 DIAGNOSIS — R2681 Unsteadiness on feet: Secondary | ICD-10-CM

## 2015-06-15 DIAGNOSIS — R531 Weakness: Secondary | ICD-10-CM | POA: Diagnosis not present

## 2015-06-15 NOTE — Therapy (Signed)
[facility] [address] [place], [place], [address] Phone: [phone]   Fax:  [phone]  Physical Therapy Treatment  Patient Details  Name: [name] [name] MRN: [id number] Date of Birth: May 08, 1976 No Data Recorded  Encounter Date: 06/15/2015      PT End of Session - 06/15/15 0852    Visit Number 2   Number of Visits 9   Date for PT Re-Evaluation 08/09/15   Authorization Type BCBS   Authorization Time Period 60 combined visits   PT Start Time 0848   PT Stop Time 0928   PT Time Calculation (min) 40 min   Activity Tolerance Patient tolerated treatment well   Behavior During Therapy [facility] for tasks assessed/performed      Past Medical History  Diagnosis Date  . Hypertension   . ARF (acute renal failure) (HCC) 09/2014    admitted with electrolyte disturbance, ARF to [facility].   Marland Kitchen Positive H. pylori test 06/2014    serum H pylori + and treated.   . 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 Laparoscopic gastric sleeve resection  06/2014    Dr Olin Pia of [facility]  . Endometrial ablation w/ novasure    . Tubal ligation  2002  . Esophagogastroduodenoscopy  2016    Dr Mariana Kaufman of [facility].  found gastritis, erosion at bypass surgery site.     There were no vitals filed for this visit.  Visit Diagnosis:  Generalized weakness  Unsteadiness      Subjective Assessment - 06/15/15 0851    Subjective No new complaints. No falls or pain to report. States HEP is going well at home.   Currently in Pain? No/denies   Multiple Pain Sites No     Treatment: Exercises: Side lying clam shell, advanced to using red band for resistance as pt reported easy at home. 5 sec hold x 10 reps each side. Quadruped: alternating contralateral UE/leg raises x 10 each side. Cues on pelvic and core stability with this exercise. Chair pose,  10 sec hold x 5 reps. Cues on form.  Scifit level 3.0 x 8 minutes with goal rpm >/=50 for strengthening and activity tolerance.  Neuro re-ed: Corner on 2 pillows- issued HEP for balance, refer to pt ins         PT Long Term Goals - 06/10/15 1637    PT LONG TERM GOAL #1   Title Pt will be independent with HEP for strengthening and balance to decrease fall risk and increase functional strength.  (Target Date: 07/08/15)   Time 4   Period Weeks   PT LONG TERM GOAL #2   Title Pt will increase FGA score to 29/30 in order to indicate improvement in functional balance and score within age matched normals.   (Target Date: 07/08/15)   Time 4   Period Weeks   PT LONG TERM GOAL #3   Title Pt will ambulate >1000' on outdoor surfaces (paved, grass, gravel, curb, ramp, etc) without AD at independent level to indicate safe negotiation in community.  (Target Date: 07/08/15)   Time 4   Period Weeks   PT LONG TERM GOAL #4   Title Pt will increase gait speed to 3.57 ft/sec to increase efficiency of gait to more normal walking speed of 4.37 ft/sec.   (Target Date: 07/08/15)  Plan - 06/15/15 [id number]    Clinical Impression Statement Advanced hip strengthening home exercise to include resistance with red theraband and added corner balance exercises today without any issues. Pt making steady progress toward goals. Also discussed with pt joining gym for community fitness post therapy.                                       Pt will benefit from skilled therapeutic intervention in order to improve on the following deficits Decreased activity tolerance;Decreased balance;Decreased endurance;Decreased strength;Impaired perceived functional ability;Postural dysfunction;Improper body mechanics   Rehab Potential Excellent   PT Frequency 2x / week   PT Duration 4 weeks   PT Treatment/Interventions Gait training;Stair training;Functional mobility training;Therapeutic activities;Therapeutic exercise;Balance  training;Neuromuscular re-education;Patient/family education   PT Next Visit Plan Add strengthening exercises with dumbell (pt as 2 and 5# hand weights at home), continue to work on high level balance, balance on compliant surfaces   PT Home Exercise Plan 06/15/15: side lying clamshell with red band, chair pose, quadruped alter. leg/arm raises, corner on pillows: narrow base EC, wide base EO head movements   Consulted and Agree with Plan of Care Patient;Family member/caregiver   Family Member Consulted Husband        Problem List Patient Active Problem List   Diagnosis Date Noted  . Calculus of gallbladder with acute on chronic cholecystitis without obstruction   . Leg weakness   . Adjustment disorder with mixed anxiety and depressed mood 11/30/2014  . FTT (failure to thrive) in adult   . Confusion   . Essential hypertension   . Sinus tachycardia (HCC)   . 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 Elevated LFTs 11/26/2014  . Obesity 11/26/2014  . Anxiety 11/26/2014  . Cholelithiasis 11/26/2014    Sallyanne KusterBury, Halo Shevlin 06/15/2015, 1:09 PM  Sallyanne KusterKathy Trevone Prestwood, PTA, [facility] [address], [address] [place], [place] [address] [phone] 06/15/2015, 1:09 PM   Name: [name] [name] MRN: [id number] Date of Birth: 03/19/1976

## 2015-06-15 NOTE — Patient Instructions (Addendum)
   Clamshell  Lay on side with body in neutral, hips in neutral position  with shoulders and ankles in the same plane.  Now bend your knees bringing your heels slightly back behind you.  with red band around legs just above knees, lift top leg up toward ceiling. hold for 5 seconds. 10 reps each leg. 1x a day.    ** Be careful not to allow your hip to roll backwards while liftng the knee **  Feet Together (Compliant Surface) Varied Arm Positions - Eyes Closed    In corner with chair in front of you for safety: Stand on compliant surface: _pillow_ with feet together and arms at sides/as needed for balance. Close eyes and visualize upright position. Hold__10__ seconds. Repeat __3-5__ times per session. Do _1-2___ sessions per day.  Copyright  VHI. All rights reserved.  Feet Together (Compliant Surface) Head Motion - Eyes Open    In corner with chair in front of you for safety: With eyes open, standing on compliant surface: _pillows_, feet together, move head:  1. slowly: up and down x 10 each way 2. Slowly: side to side, x 10 each way 3. Slowly: diagonals, both ways  10 reps  Do __1-2__ sessions per day.  Copyright  VHI. All rights reserved.

## 2015-06-17 ENCOUNTER — Ambulatory Visit: Payer: BLUE CROSS/BLUE SHIELD

## 2015-06-17 DIAGNOSIS — R531 Weakness: Secondary | ICD-10-CM | POA: Diagnosis not present

## 2015-06-17 DIAGNOSIS — R41841 Cognitive communication deficit: Secondary | ICD-10-CM

## 2015-06-17 NOTE — Therapy (Signed)
[facility] [address] [place], [place], [address] Phone: [phone]   Fax:  [phone]  Speech Language Pathology Treatment  Patient Details  Name: [name] [name] MRN: [id number] Date of Birth: 09/14/1975 No Data Recorded  Encounter Date: 06/17/2015      End of Session - 06/17/15 1746    Visit Number 2   Number of Visits 17   Date for SLP Re-Evaluation 08/10/15   SLP Start Time 1450   SLP Stop Time  1530   SLP Time Calculation (min) 40 min   Activity Tolerance Patient tolerated treatment well      Past Medical History  Diagnosis Date  . Hypertension   . ARF (acute renal failure) (HCC) 09/2014    admitted with electrolyte disturbance, ARF to [facility].   Marland Kitchen Positive H. pylori test 06/2014    serum H pylori + and treated.   . 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 Laparoscopic gastric sleeve resection  06/2014    Dr Olin Pia of [facility]  . Endometrial ablation w/ novasure    . Tubal ligation  2002  . Esophagogastroduodenoscopy  2016    Dr Mariana Kaufman of [facility].  found gastritis, erosion at bypass surgery site.     There were no vitals filed for this visit.  Visit Diagnosis: Cognitive communication deficit      Subjective Assessment - 06/17/15 1451    Patient is accompained by: --  Neta Mends - sister in law   Currently in Pain? No/denies               ADULT SLP TREATMENT - 06/17/15 1452    General Information   Behavior/Cognition Alert;Cooperative;Pleasant mood   Treatment Provided   Treatment provided Cognitive-Linquistic   Pain Assessment   Pain Assessment No/denies pain   Cognitive-Linquistic Treatment   Treatment focused on Cognition   Skilled Treatment Pt has not done journal yet, so SLP encouraged pt to begin that ASAP. Pt provided SLP three details from the weekend without cues.  In  memory tasks of spaced recall, SLP assisted pt in using repeat strategy with mixed results: pt recalled 1/5 items and 1/4 items after 10 minutes and 5 minutes respectively. This increased with SLP mod-max cues consistently. SLP again reiterated pt's need to write down details of her day, especially conversations as husband mentioned this specifically as something that occurs consistently.   Assessment / Recommendations / Plan   Plan Continue with current plan of care   Progression Toward Goals   Progression toward goals Progressing toward goals          SLP Education - 06/17/15 1746    Education provided Yes   Education Details need to use journal to track items during the day, especially details of conversations   Person(s) Educated Patient;Caregiver(s)   Methods Explanation;Demonstration   Comprehension Verbalized understanding;Need further instruction          SLP Short Term Goals - 06/17/15 1748    SLP SHORT TERM GOAL #1   Title pt to record average 5 events daily in memory notebook, 4 days a week   Time 4   Period Weeks   Status On-going   SLP SHORT TERM GOAL #2   Title pt to recall pertinent information from therapy session after 10 minute delay using compensations except writing   Time 4   Period Weeks  Status On-going          SLP Long Term Goals - 06/17/15 1748    SLP LONG TERM GOAL #1   Title pt to record average 5 events in memory notebook/device at least 4 days per week 75% of opportunities (weeks)   Time 8   Period Weeks   Status On-going   SLP LONG TERM GOAL #2   Title pt will recall pertinent information after 15 minutes during therapy sessions using compensations (except writing)   Time 8   Period Weeks   Status On-going          Plan - 06/17/15 1747    Clinical Impression Statement Pt has not began using journal/memory notebook. SLP again reiterated this need as pt's recall improves significantly with writing information down for her to return  to later. Skilled ST remains needed to carry over memory strategies into situations outside ST.   Speech Therapy Frequency 2x / week   Duration --  8 weeks   Treatment/Interventions Compensatory strategies;Internal/external aids;Cognitive reorganization;SLP instruction and feedback;Patient/family education;Functional tasks   Potential to Achieve Goals Fair   Potential Considerations Severity of impairments        Problem List Patient Active Problem List   Diagnosis Date Noted  . Calculus of gallbladder with acute on chronic cholecystitis without obstruction   . Leg weakness   . Adjustment disorder with mixed anxiety and depressed mood 11/30/2014  . FTT (failure to thrive) in adult   . Confusion   . Essential hypertension   . Sinus tachycardia (HCC)   . 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 Elevated LFTs 11/26/2014  . Obesity 11/26/2014  . Anxiety 11/26/2014  . Cholelithiasis 11/26/2014    Medina Memorial HospitalCHINKE,CARL , MS, CCC-SLP  06/17/2015, 5:51 PM  [facility] [address] [place], [place], [address] Phone: [phone]   Fax:  [phone]   Name: [name] [name] MRN: [id number] Date of Birth: 03/17/1976

## 2015-06-18 ENCOUNTER — Ambulatory Visit: Payer: BLUE CROSS/BLUE SHIELD | Admitting: Rehabilitation

## 2015-06-18 ENCOUNTER — Encounter: Payer: Self-pay | Admitting: Rehabilitation

## 2015-06-18 DIAGNOSIS — R531 Weakness: Secondary | ICD-10-CM

## 2015-06-18 NOTE — Therapy (Signed)
[facility] [address] [place], [place], [address] Phone: [phone]   Fax:  [phone]  Physical Therapy Treatment  Patient Details  Name: [name] [name] MRN: [id number] Date of Birth: 1975/12/13 No Data Recorded  Encounter Date: 06/18/2015      PT End of Session - 06/19/15 1529    Visit Number 3   Number of Visits 9   Date for PT Re-Evaluation 08/09/15   Authorization Type [employer]   Authorization Time Period 60 combined visits   PT Start Time 1500  pt late for appt   PT Stop Time 1530   PT Time Calculation (min) 30 min   Activity Tolerance Patient tolerated treatment well   Behavior During Therapy [facility] for tasks assessed/performed      Past Medical History  Diagnosis Date  . Hypertension   . ARF (acute renal failure) (HCC) 09/2014    admitted with electrolyte disturbance, ARF to [facility].   Marland Kitchen Positive H. pylori test 06/2014    serum H pylori + and treated.   . 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 Laparoscopic gastric sleeve resection  06/2014    Dr Olin Pia of [facility]  . Endometrial ablation w/ novasure    . Tubal ligation  2002  . Esophagogastroduodenoscopy  2016    Dr Mariana Kaufman of [facility].  found gastritis, erosion at bypass surgery site.     There were no vitals filed for this visit.  Visit Diagnosis:  Generalized weakness      Subjective Assessment - 06/18/15 1502    Subjective No new complaints, no falls, HEP still going well.     Patient is accompained by: Family member   Pertinent History gastic bypass, anxiety    Limitations Sitting;Walking   Currently in Pain? No/denies                 Therex:  Quadruped, alternating UE/LE x 15 reps in order to work on core strength and stabilization with cues for decreased trunk and pelvic rotation.  Then performed side stepping with red  theraband with squats while in abducted position x 15 reps in each direction for quad/glute med/max strengthening.  Tolerated well with min cues for technique.  Then performed squats with 4lb shoulder flex x 10 reps with min cues for safe form to prevent knee injury.  Transitioned to core strengthening as well as UE strength with plank position x 4 reps of 15 secs.  Tactile cues to hold position at neutral spine to prevent increased lordosis.  Attempted to perform plank on elbows to plank on extended arms, however pt unable to do so, therefore modified to transitions from elbows to extended arms while on knees to increase distal support.  Tolerated well.  Ended session with leg press x 10 reps at 30 lbs then another 10 reps at 50lbs with cues for slow controlled movement.  Continue to educate on importance of getting into community fitness program for overall wellness and strengthening.                 PT Education - 06/19/15 1528    Education provided Yes   Education Details Continued education on joining gym and/or fitness program.    Person(s) Educated Patient   Methods Explanation   Comprehension Verbalized understanding             PT Long  Term Goals - 06/10/15 1637    PT LONG TERM GOAL #1   Title Pt will be independent with HEP for strengthening and balance to decrease fall risk and increase functional strength.  (Target Date: 07/08/15)   Time 4   Period Weeks   PT LONG TERM GOAL #2   Title Pt will increase FGA score to 29/30 in order to indicate improvement in functional balance and score within age matched normals.   (Target Date: 07/08/15)   Time 4   Period Weeks   PT LONG TERM GOAL #3   Title Pt will ambulate >1000' on outdoor surfaces (paved, grass, gravel, curb, ramp, etc) without AD at independent level to indicate safe negotiation in community.  (Target Date: 07/08/15)   Time 4   Period Weeks   PT LONG TERM GOAL #4   Title Pt will increase gait speed to 3.57  ft/sec to increase efficiency of gait to more normal walking speed of 4.37 ft/sec.   (Target Date: 07/08/15)               Plan - 06/19/15 1529    Clinical Impression Statement Skilled session focused on generalized strengthening and core stabilization with balance.  Pt tolerated all well, however continues to demonstrate poor core and UE strength.  Will continue to address along with balance during next session.  Continue POC.    Pt will benefit from skilled therapeutic intervention in order to improve on the following deficits Decreased activity tolerance;Decreased balance;Decreased endurance;Decreased strength;Impaired perceived functional ability;Postural dysfunction;Improper body mechanics   Rehab Potential Excellent   PT Frequency 2x / week   PT Duration 4 weeks   PT Treatment/Interventions Gait training;Stair training;Functional mobility training;Therapeutic activities;Therapeutic exercise;Balance training;Neuromuscular re-education;Patient/family education   PT Next Visit Plan Add strengthening exercises with dumbell (pt as 2 and 5# hand weights at home), continue to work on high level balance, balance on compliant surfaces   Consulted and Agree with Plan of Care Patient;Family member/caregiver        Problem List Patient Active Problem List   Diagnosis Date Noted  . Calculus of gallbladder with acute on chronic cholecystitis without obstruction   . Leg weakness   . Adjustment disorder with mixed anxiety and depressed mood 11/30/2014  . FTT (failure to thrive) in adult   . Confusion   . Essential hypertension   . Sinus tachycardia (HCC)   . 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 Elevated LFTs 11/26/2014  . Obesity 11/26/2014  . Anxiety 11/26/2014  . Cholelithiasis 11/26/2014    Harriet ButteEmily Jehu Mccauslin, PT, MPT [facility] [address] [place], [place], [address] Phone: [phone]   Fax:  [phone] 06/19/2015, 3:35 PM  Name: [name] [name] MRN:  [id number] Date of Birth: 11/04/1975

## 2015-06-23 ENCOUNTER — Ambulatory Visit: Payer: BLUE CROSS/BLUE SHIELD | Admitting: Physical Therapy

## 2015-06-23 DIAGNOSIS — R531 Weakness: Secondary | ICD-10-CM

## 2015-06-23 DIAGNOSIS — R2681 Unsteadiness on feet: Secondary | ICD-10-CM

## 2015-06-24 ENCOUNTER — Encounter: Payer: Self-pay | Admitting: Physical Therapy

## 2015-06-24 NOTE — Therapy (Signed)
[facility] [address] [place], [place], [address] Phone: [phone]   Fax:  [phone]  Physical Therapy Treatment  Patient Details  Name: [name] FantasiaChristel Smaldone MRN: [id number] Date of Birth: 05/31/1976 No Data Recorded  Encounter Date: 06/23/2015      PT End of Session - 06/24/15 0810    Visit Number 4   Number of Visits 10   Date for PT Re-Evaluation 08/09/15   PT Start Time 1526   PT Stop Time 1611   PT Time Calculation (min) 45 min   Equipment Utilized During Treatment Gait belt   Activity Tolerance Patient tolerated treatment well   Behavior During Therapy [facility] for tasks assessed/performed      Past Medical History  Diagnosis Date  . Hypertension   . ARF (acute renal failure) (HCC) 09/2014    admitted with electrolyte disturbance, ARF to [facility].   Marland Kitchen. Positive H. pylori test 06/2014    serum H pylori + and treated.   . 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 Laparoscopic gastric sleeve resection  06/2014    [facility]  . Endometrial ablation w/ novasure    . Tubal ligation  2002  . Esophagogastroduodenoscopy  2016    [facility] Bray [facility].  found gastritis, erosion at bypass surgery site.     There were no vitals filed for this visit.  Visit Diagnosis:  Generalized weakness  Unsteadiness      Subjective Assessment - 06/24/15 0805    Subjective Pt. stated that she was in no pain. Pt. expressed concern of her bilateral foot drop.   Currently in Pain? No/denies            PT Education - 06/24/15 0808    Education provided Yes   Education Details Pt. educated on resisted dorsi flexion with thera band to reduce foot drop. Pt. educated on the importance of daily exercise to improve function and reduce risk of falls.   Person(s) Educated Patient   Methods Explanation   Comprehension  Verbalized understanding     Therapeutic exercise: Cues for scapular stabilization. Cues for foot placement during Airex squats. Cues for correct posture. Pt. Required min guard to min assist during Ther-ex. Quadruped alternating arm flexion and hip ext. 2 sets of 10 reps. Forearm planks: 3 sets. 30 sec., 20 sec. 40 sec. Step ups with high knee and 5 sec. Hold onto six inch step with airX on top, using parallel bars for support. Lateral band walks in squat position with yellow Theraband. Shoulder ext. With yellow theraband, 1 set of 12 reps. Parallel bar push ups, 1 set 10 reps. Airex squats, 1 set 20 reps. Pt. Educated on lawnmower shoulder pulls with dumbell resistance. Pt. Educated on resisted dorsiflexion with yellow Theraband. Pt. Competed 16 steps without hand rails to improve strength and balance.  Gait: Pt. Completed 850 ft. Of gait on outside, paved and unlevel surfaces with Min guard assist. Gait velocity was fluctuated by therapist  during gait training to improve balance and endurance. Pt. Presents with B foot drop.   Pt. Completed 16 steps with bilateral hand rails      PT Long Term Goals - 06/24/15 2243    PT LONG TERM GOAL #1   Title Pt will be independent with HEP for strengthening and balance to decrease fall risk and increase functional strength.  (  Target Date: 07/08/15)   Status On-going   PT LONG TERM GOAL #2   Title Pt will increase FGA score to 29/30 in order to indicate improvement in functional balance and score within age matched normals.   (Target Date: 07/08/15)   Status On-going   PT LONG TERM GOAL #3   Title Pt will ambulate >1000' on outdoor surfaces (paved, grass, gravel, curb, ramp, etc) without AD at independent level to indicate safe negotiation in community.  (Target Date: 07/08/15)   Status On-going   PT LONG TERM GOAL #4   Title Pt will increase gait speed to 3.57 ft/sec to increase efficiency of gait to more normal walking speed of 4.37 ft/sec.    (Target Date: 07/08/15)   Status On-going           Plan - 06/24/15 0816    Clinical Impression Statement Pt. is progressing towards all 4 of her LTG's.   Pt will benefit from skilled therapeutic intervention in order to improve on the following deficits Abnormal gait;Impaired UE functional use;Decreased endurance   Rehab Potential Good   PT Frequency 2x / week   PT Duration 4 weeks   PT Treatment/Interventions Balance training;Therapeutic exercise;Gait training   PT Next Visit Plan Continue with POC. Continue to address pt.'s bil foot drop to reduce falls risk and to increase gait efficiency. Add new UE and LE ex.'s to HEP as needed.   PT Home Exercise Plan 06/15/15: side lying clamshell with red band, chair pose, quadruped alter. leg/arm raises, corner on pillows: narrow base EC, wide base EO head movements   Consulted and Agree with Plan of Care Patient        Problem List Patient Active Problem List   Diagnosis Date Noted  . Calculus of gallbladder with acute on chronic cholecystitis without obstruction   . Leg weakness   . Adjustment disorder with mixed anxiety and depressed mood 11/30/2014  . FTT (failure to thrive) in adult   . Confusion   . Essential hypertension   . Sinus tachycardia (HCC)   . 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 Elevated LFTs 11/26/2014  . Obesity 11/26/2014  . Anxiety 11/26/2014  . Cholelithiasis 11/26/2014    Haze Justin 06/24/2015, 8:31 AM  Haze Justin, SPTA  Name: Corynn Solberg MRN: [id number] Date of Birth: 1976/01/11    This note has been reviewed and edited by supervising CI.  Sallyanne Kuster, PTA, [facility] [address] [place], [place] [address] [phone] 06/24/2015, 10:39 PM

## 2015-06-26 ENCOUNTER — Encounter: Payer: Self-pay | Admitting: Physical Therapy

## 2015-06-26 ENCOUNTER — Ambulatory Visit: Payer: BLUE CROSS/BLUE SHIELD | Admitting: Physical Therapy

## 2015-06-26 DIAGNOSIS — R531 Weakness: Secondary | ICD-10-CM

## 2015-06-26 DIAGNOSIS — R2681 Unsteadiness on feet: Secondary | ICD-10-CM

## 2015-06-26 DIAGNOSIS — R41841 Cognitive communication deficit: Secondary | ICD-10-CM

## 2015-06-26 NOTE — Therapy (Signed)
[facility] [address] [place], [place], [address] Phone: [phone]   Fax:  [phone]  Physical Therapy Treatment  Patient Details  Name: [name] FantasiaChristel Dalgleish MRN: [id number] Date of Birth: 04/07/1976 No Data Recorded  Encounter Date: 06/26/2015   06/26/15 1536  PT Visits / Re-Eval  Visit Number 5  Number of Visits 10  Date for PT Re-Evaluation 08/09/15  PT Time Calculation  PT Start Time 1535  PT Stop Time 1615  PT Time Calculation (min) 40 min  PT - End of Session  Equipment Utilized During Treatment Gait belt  Activity Tolerance Patient tolerated treatment well  Behavior During Therapy [facility] for tasks assessed/performed     Past Medical History  Diagnosis Date  . Hypertension   . ARF (acute renal failure) (HCC) 09/2014    admitted with electrolyte disturbance, ARF to [facility].   Marland Kitchen. Positive H. pylori test 06/2014    serum H pylori + and treated.   . 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 Laparoscopic gastric sleeve resection  06/2014    [facility]  . Endometrial ablation w/ novasure    . Tubal ligation  2002  . Esophagogastroduodenoscopy  2016    [facility].  found gastritis, erosion at bypass surgery site.     There were no vitals filed for this visit.  Visit Diagnosis:   Generalized weakness    Unsteadiness     Cognitive communication deficit        Subjective Assessment - 06/26/15 1536    Subjective No new compaints. No pain or falls to report.    Currently in Pain? No/denies   Multiple Pain Sites No     Treatment: Hook lying - bridge 5 sec hold x 10 reps - bridge with alternating marching x 10 reps each leg - bridge with hip abduction/ER with yellow band x 10 reps - table top hold, 15 sec's x 5 reps - table top hold position with alternating heel taps to mat x  10 reps each side - partial V sit with pball pass to<>from legs<>hands (all extremities in extension) x 8 passes (reps)   quadruped - alternating leg outs x 10 reps each side - alternating combo contralateral leg/UE raises x 10 reps each side - conralaeral elbow to knee touch for trunk flexion into extension for trunk extension x 10 each side (alternating sides)  Forearm plank: 15 sec hold x 3 reps with good form, cues on abdominals bracing   Full plank (pt more in downward dog position due to weakness): moving arms from elbow extension to forearm propping back to elbow extension, 3 complete cycles x 3 reps         PT Long Term Goals - 06/24/15 2243    PT LONG TERM GOAL #1   Title Pt will be independent with HEP for strengthening and balance to decrease fall risk and increase functional strength.  (Target Date: 07/08/15)   Status On-going   PT LONG TERM GOAL #2   Title Pt will increase FGA score to 29/30 in order to indicate improvement in functional balance and score within age matched normals.   (Target Date: 07/08/15)   Status On-going   PT LONG TERM GOAL #3   Title Pt will ambulate >1000' on outdoor surfaces (paved, grass, gravel, curb, ramp, etc) without AD at independent  level to indicate safe negotiation in community.  (Target Date: 07/08/15)   Status On-going   PT LONG TERM GOAL #4   Title Pt will increase gait speed to 3.57 ft/sec to increase efficiency of gait to more normal walking speed of 4.37 ft/sec.   (Target Date: 07/08/15)   Status On-going        06/26/15 1537  Plan  Clinical Impression Statement Continued to focues on overall strengthening and activity tolerance without any issues reported during session. Pt making steady progress toward goals.con  Pt will benefit from skilled therapeutic intervention in order to improve on the following deficits Abnormal gait;Impaired UE functional use;Decreased endurance  Rehab Potential Good  PT Frequency 2x / week  PT  Duration 4 weeks  PT Treatment/Interventions Balance training;Therapeutic exercise;Gait training  PT Next Visit Plan continue to work on strengthening, balance and gait (specifically addressing ankle/anterior tib weakness) toward pt's goals  PT Home Exercise Plan 06/15/15: side lying clamshell with red band, chair pose, quadruped alter. leg/arm raises, corner on pillows: narrow base EC, wide base EO head movements  Consulted and Agree with Plan of Care Patient    Problem List Patient Active Problem List   Diagnosis Date Noted  . Calculus of gallbladder with acute on chronic cholecystitis without obstruction   . Leg weakness   . Adjustment disorder with mixed anxiety and depressed mood 11/30/2014  . FTT (failure to thrive) in adult   . Confusion   . Essential hypertension   . Sinus tachycardia (HCC)   . 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 Elevated LFTs 11/26/2014  . Obesity 11/26/2014  . Anxiety 11/26/2014  . Cholelithiasis 11/26/2014    [name] [name] 06/26/2015, 3:39 PM  [name] [name], PTA, [facility] [address], [address] [place], [place] [address] [phone] 06/27/2015, 11:37 PM   Name: [name] [name] MRN: [id number] Date of Birth: 04-14-1976

## 2015-06-30 ENCOUNTER — Ambulatory Visit: Payer: BLUE CROSS/BLUE SHIELD | Attending: Family Medicine | Admitting: Physical Therapy

## 2015-06-30 ENCOUNTER — Ambulatory Visit: Payer: BLUE CROSS/BLUE SHIELD | Admitting: Speech Pathology

## 2015-06-30 ENCOUNTER — Encounter: Payer: Self-pay | Admitting: Physical Therapy

## 2015-06-30 DIAGNOSIS — R41841 Cognitive communication deficit: Secondary | ICD-10-CM | POA: Insufficient documentation

## 2015-06-30 DIAGNOSIS — R531 Weakness: Secondary | ICD-10-CM | POA: Diagnosis present

## 2015-06-30 DIAGNOSIS — R2681 Unsteadiness on feet: Secondary | ICD-10-CM

## 2015-06-30 NOTE — Therapy (Signed)
[facility] [address] [place], [place], [address] Phone: [phone]   Fax:  [phone]  Speech Language Pathology Treatment  Patient Details  Name: [name] [name] MRN: [id number] Date of Birth: 03/11/1976 No Data Recorded  Encounter Date: 06/30/2015      End of Session - 06/30/15 1017    Visit Number 3   Number of Visits 17   Date for SLP Re-Evaluation 08/10/15   SLP Start Time 0931   SLP Stop Time  1013   SLP Time Calculation (min) 42 min      Past Medical History  Diagnosis Date  . Hypertension   . ARF (acute renal failure) (HCC) 09/2014    admitted with electrolyte disturbance, ARF to [facility].   Marland Kitchen. Positive H. pylori test 06/2014    serum H pylori + and treated.   . 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 Laparoscopic gastric sleeve resection  06/2014    [facility]  . Endometrial ablation w/ novasure    . Tubal ligation  2002  . Esophagogastroduodenoscopy  2016    [facility] Mariana [facility]ray [facility].  found gastritis, erosion at bypass surgery site.     There were no vitals filed for this visit.  Visit Diagnosis: Cognitive communication deficit      Subjective Assessment - 06/30/15 0937    Subjective "I forgot my homework"   Currently in Pain? No/denies   Multiple Pain Sites No               ADULT SLP TREATMENT - 06/30/15 0938    General Information   Behavior/Cognition Alert;Cooperative;Pleasant mood   Treatment Provided   Treatment provided Cognitive-Linquistic   Pain Assessment   Pain Assessment No/denies pain   Cognitive-Linquistic Treatment   Treatment focused on Cognition   Skilled Treatment Pt reports she has a journal, and is using it to write down tasks, lists and thing she needs to remember.  Facilitated memory strategies with memory game with usual mod A - and instruction  for strategie for recall. Trained pt in memory enhancing activities she is to do at home,. Pt studied detailed pictures,  the picture was removed. Pt recalled details on picutre with 50% accuracy, usual min to mod questioning cues.  Trained pt in memory strategy of mental image and associate to recall 5 unrelated words. Pt immediately recalled 5/5 words over 3 trials with min A, with 5 min delay, she recalled 2/5 with questioning cues and cues to recall her strategies. Pt instrcuted to practice these tasks at home daily.   Assessment / Recommendations / Plan   Plan Continue with current plan of care   Progression Toward Goals   Progression toward goals Progressing toward goals          SLP Education - 06/30/15 1016    Education provided Yes   Education Details memory strategies of association/mental image   Person(s) Educated Patient   Methods Explanation   Comprehension Verbalized understanding;Returned demonstration;Need further instruction;Verbal cues required          SLP Short Term Goals - 06/30/15 1017    SLP SHORT TERM GOAL #1   Title pt to record average 5 events daily in memory notebook, 4 days a week   Time 3   Period Weeks   Status On-going   SLP SHORT TERM GOAL #2  Title pt to recall pertinent information from therapy session after 10 minute delay using compensations except writing   Time 3   Period Weeks   Status On-going          SLP Long Term Goals - 06/30/15 1017    SLP LONG TERM GOAL #1   Title pt to record average 5 events in memory notebook/device at least 4 days per week 75% of opportunities (weeks)   Time 7   Period Weeks   Status On-going   SLP LONG TERM GOAL #2   Title pt will recall pertinent information after 15 minutes during therapy sessions using compensations (except writing)   Time 7   Period Weeks   Status On-going          Plan - 06/30/15 1016    Clinical Impression Statement Instrcuted pt to bring in memory notebook and calendar.  Continue skilled ST to maximize carryover of memory strategies outside of therapy.   Speech Therapy Frequency 2x / week   Treatment/Interventions Compensatory strategies;Internal/external aids;Cognitive reorganization;SLP instruction and feedback;Patient/family education;Functional tasks   Potential Considerations Severity of impairments   Consulted and Agree with Plan of Care Patient;Family member/caregiver        Problem List Patient Active Problem List   Diagnosis Date Noted  . Calculus of gallbladder with acute on chronic cholecystitis without obstruction   . Leg weakness   . Adjustment disorder with mixed anxiety and depressed mood 11/30/2014  . FTT (failure to thrive) in adult   . Confusion   . Essential hypertension   . Sinus tachycardia (HCC)   . 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 Elevated LFTs 11/26/2014  . Obesity 11/26/2014  . Anxiety 11/26/2014  . Cholelithiasis 11/26/2014    [name] [name], Radene Journey MS, CCC-SLP 06/30/2015, 10:18 AM  [facility] [address] [place], [place], [address] Phone: [phone]   Fax:  [phone]   Name: [name] [name] MRN: [id number] Date of Birth: 31-Oct-1975

## 2015-06-30 NOTE — Therapy (Signed)
[facility] [address] [place], [place], [address] Phone: [phone]   Fax:  [phone]  Physical Therapy Treatment  Patient Details  Name: [name] [name] MRN: [id number] Date of Birth: 02/04/1976 No Data Recorded  Encounter Date: 06/30/2015      PT End of Session - 06/30/15 1112    Visit Number 6   Number of Visits 10   Date for PT Re-Evaluation 08/09/15   Authorization Type BCBS   PT Start Time 1015   PT Stop Time 1100   PT Time Calculation (min) 45 min   Equipment Utilized During Treatment Gait belt   Activity Tolerance Patient tolerated treatment well   Behavior During Therapy [facility] for tasks assessed/performed      Past Medical History  Diagnosis Date  . Hypertension   . ARF (acute renal failure) (HCC) 09/2014    admitted with electrolyte disturbance, ARF to [facility].   Marland Kitchen. Positive H. pylori test 06/2014    serum H pylori + and treated.   . 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 Laparoscopic gastric sleeve resection  06/2014    [facility]  . Endometrial ablation w/ novasure    . Tubal ligation  2002  . Esophagogastroduodenoscopy  2016    [facility].  found gastritis, erosion at bypass surgery site.     There were no vitals filed for this visit.  Visit Diagnosis:  Generalized weakness  Unsteadiness  Cognitive communication deficit      Subjective Assessment - 06/30/15 1108    Subjective No new complaints. Pt. stated that she could see an increase in her endurance.   Patient is accompained by: Family member   Pertinent History gastic bypass, anxiety    Limitations Sitting;Walking   Currently in Pain? No/denies           PT Education - 06/30/15 1110    Education provided Yes   Education Details Pt. educated on home work out program because she cannot get to the  gym due to her husbands work schedule. Pt. was encouraged to do the strengthening exercises every other day of the week and find a safe place to walk on the alternate days.   Person(s) Educated Patient   Methods Explanation   Comprehension Verbalized understanding     Therapeutic exercise: To improve strength, balance and conditioning. Verbal cues for proper foot placement during squats, And also for core engagement and shoulder stabilization during pushups.  Walking: Pt. completed 4 laps on track indoors, mod Independent without AD. Pt. completed 103800ft. outside on paved surfaces With changing gait speed to build endurance. Pt. Required mod I assist to complete the walking.  Planks on mat: 3 sets, 30sec., 45sec., 60sec.  Squats with 2lbs dumbell alternating overhead press: 2 sets, 15 reps. Lateral walking high knees with squat after the step out: 2 sets, 8 reps each way. Elevated pushups on parallel bars: 2 sets, 10 reps. Alternating step ups on curb with high tempo to increase endurance: 1 set, 12 each side.          PT Long Term Goals - 06/30/15 1115    PT LONG TERM GOAL #1   Title Pt will be independent with HEP for strengthening and balance to decrease fall risk and increase functional strength.  (Target Date: 07/08/15)   Time 4   Period  Weeks   Status On-going   PT LONG TERM GOAL #2   Title Pt will increase FGA score to 29/30 in order to indicate improvement in functional balance and score within age matched normals.   (Target Date: 07/08/15)   Time 4   Period Weeks   Status On-going   PT LONG TERM GOAL #3   Title Pt will ambulate >1000' on outdoor surfaces (paved, grass, gravel, curb, ramp, etc) without AD at independent level to indicate safe negotiation in community.  (Target Date: 07/08/15)   Time 4   Period Weeks   Status On-going   PT LONG TERM GOAL #4   Title Pt will increase gait speed to 3.57 ft/sec to increase efficiency of gait to more normal walking speed of  4.37 ft/sec.   (Target Date: 07/08/15)   Time 4   Period Weeks   Status On-going           Plan - 06/30/15 1113    Clinical Impression Statement Pt. is progressing  towards all goals. Pt. Has walked 1075ft. on paved surfaces mod Independent. Pt. ambulated 226ft. in 55 seconds for a gait speed of 4.18 (230/55=4.18) on level, indoor surfaces.   Pt will benefit from skilled therapeutic intervention in order to improve on the following deficits Abnormal gait;Impaired UE functional use;Decreased endurance   Rehab Potential Good   PT Frequency 2x / week   PT Duration 4 weeks   PT Treatment/Interventions Balance training;Therapeutic exercise;Gait training   PT Next Visit Plan continue to work on strengthening, balance and gait (specifically addressing ankle/anterior tib weakness) toward pt's goals. Make sure to ambulate on uneven, grass surface to achieve goal 2. Update HEP.   PT Home Exercise Plan 06/15/15: side lying clamshell with red band, chair pose, quadruped alter. leg/arm raises, corner on pillows: narrow base EC, wide base EO head movements   Consulted and Agree with Plan of Care Patient   Family Member Consulted Husband        Problem List Patient Active Problem List   Diagnosis Date Noted  . Calculus of gallbladder with acute on chronic cholecystitis without obstruction   . Leg weakness   . Adjustment disorder with mixed anxiety and depressed mood 11/30/2014  . FTT (failure to thrive) in adult   . Confusion   . Essential hypertension   . Sinus tachycardia (HCC)   . 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 Elevated LFTs 11/26/2014  . Obesity 11/26/2014  . Anxiety 11/26/2014  . Cholelithiasis 11/26/2014    Haze Justin 07/01/2015, 9:01 AM  Haze Justin, SPTA  Name: [name] [name] MRN: [id number] Date of Birth: [birth year]  This note has been reviewed and edited by supervision CI.  Sallyanne Kuster, PTA, [facility] [address] [place], [place]  [address] [phone] 07/01/2015, 9:52 AM

## 2015-06-30 NOTE — Patient Instructions (Signed)
  Memory Strategies  W - Write it down  A - Associate it with something  R - Repeat it  M - Mental Image     Play the memory game  Try to remember 3-5 items on your store list without looking  Study a detailed picture in a magazine for 1 minute, then write down everything you can remember from the picture  Apps - Melvenia BeamSimon, memory game  Board games Card games Scrabble - Ruzzle Checkers Arrow [employer]Simon

## 2015-07-03 ENCOUNTER — Ambulatory Visit: Payer: BLUE CROSS/BLUE SHIELD | Admitting: Physical Therapy

## 2015-07-03 ENCOUNTER — Ambulatory Visit: Payer: BLUE CROSS/BLUE SHIELD

## 2015-07-03 ENCOUNTER — Encounter: Payer: Self-pay | Admitting: Physical Therapy

## 2015-07-03 DIAGNOSIS — R2681 Unsteadiness on feet: Secondary | ICD-10-CM

## 2015-07-03 DIAGNOSIS — R531 Weakness: Secondary | ICD-10-CM | POA: Diagnosis not present

## 2015-07-03 DIAGNOSIS — R41841 Cognitive communication deficit: Secondary | ICD-10-CM

## 2015-07-03 NOTE — Therapy (Signed)
[facility] [address] [place], [place], [address] Phone: [phone]   Fax:  [phone]  Speech Language Pathology Treatment  Patient Details  Name: [name] [name] MRN: [id number] Date of Birth: [birth year] No Data Recorded  Encounter Date: 07/03/2015      End of Session - 07/03/15 1645    Visit Number 4   Number of Visits 17   Date for SLP Re-Evaluation 08/10/15   SLP Start Time 1532   SLP Stop Time  1615   SLP Time Calculation (min) 43 min   Activity Tolerance Patient tolerated treatment well      Past Medical History  Diagnosis Date  . Hypertension   . ARF (acute renal failure) (HCC) 09/2014    admitted with electrolyte disturbance, ARF to [facility].   Marland Kitchen Positive H. pylori test 06/2014    serum H pylori + and treated.   . 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 Laparoscopic gastric sleeve resection  06/2014    Dr Olin Pia of [facility]  . Endometrial ablation w/ novasure    . Tubal ligation  2002  . Esophagogastroduodenoscopy  2016    Dr Mariana Kaufman of [facility].  found gastritis, erosion at bypass surgery site.     There were no vitals filed for this visit.  Visit Diagnosis: Cognitive communication deficit      Subjective Assessment - 07/03/15 1541    Subjective Pt brought homework in today.               ADULT SLP TREATMENT - 07/03/15 1542    General Information   Behavior/Cognition Alert;Cooperative;Pleasant mood   Treatment Provided   Treatment provided Cognitive-Linquistic   Pain Assessment   Pain Assessment No/denies pain   Cognitive-Linquistic Treatment   Treatment focused on Cognition   Skilled Treatment Pt did not bring notebook. Pt tells SLP she has not been recording 5 events per day due to her being home by herself during the day. SLP reviewed memory strategies with pt including  mental picture. SLP had pt practice utilizing this strategy by recalling what picture sequences were used with her, after 5 minutes. Pt recalled 1/4 in these trials; 4/4 with mod A. SLP stressed to pt not to cook when home alone. Pt demo'd good anticipatory awareness, and thought of two other ways to make cooking safer if someone WERE home (text them that she was going to use the stove, have them come into the kitchen as she uses the stove).   Assessment / Recommendations / Plan   Plan Continue with current plan of care   Progression Toward Goals   Progression toward goals Progressing toward goals          SLP Education - 07/03/15 1625    Education provided Yes   Education Details memory compensations (writing, mental picture)   Person(s) Educated Patient   Methods Explanation;Demonstration   Comprehension Verbalized understanding;Need further instruction          SLP Short Term Goals - 07/03/15 1545    SLP SHORT TERM GOAL #1   Title pt to record average 3 events daily in memory notebook, 4 days a week   Time 2   Period Weeks  or 8 visits   Status Revised   SLP SHORT TERM GOAL #2   Title pt to recall pertinent information from therapy session after 10 minute  delay using compensations except writing   Time 2   Period Weeks   Status On-going          SLP Long Term Goals - 07/03/15 1552    SLP LONG TERM GOAL #1   Title pt to record average 3 events in memory notebook/device at least 4 days per week 75% of opportunities (weeks)   Time 6   Period Weeks  or 16 visits   Status On-going   SLP LONG TERM GOAL #2   Title pt will recall pertinent information after 15 minutes during therapy sessions using compensations (except writing)   Time 6   Period Weeks   Status On-going          Plan - 07/03/15 1645    Clinical Impression Statement Pt cont to exhibit memory deficits. Continue skilled ST to maximize carryover of memory strategies outside of therapy.   Speech Therapy  Frequency 2x / week   Duration --  6 weeks   Treatment/Interventions Compensatory strategies;Internal/external aids;Cognitive reorganization;SLP instruction and feedback;Patient/family education;Functional tasks   Potential to Achieve Goals Good   Potential Considerations Severity of impairments   Consulted and Agree with Plan of Care Patient;Family member/caregiver        Problem List Patient Active Problem List   Diagnosis Date Noted  . Calculus of gallbladder with acute on chronic cholecystitis without obstruction   . Leg weakness   . Adjustment disorder with mixed anxiety and depressed mood 11/30/2014  . FTT (failure to thrive) in adult   . Confusion   . Essential hypertension   . Sinus tachycardia (HCC)   . 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 Elevated LFTs 11/26/2014  . Obesity 11/26/2014  . Anxiety 11/26/2014  . Cholelithiasis 11/26/2014    [facility] CenterCHINKE,Effie Janoski , MS, CCC-SLP  07/03/2015, 4:47 PM  [facility] [address] [place], [place], [address] Phone: [phone]   Fax:  [phone]   Name: [name] FantasiaChristel Ybanez MRN: [id number] Date of Birth: 11/25/1975

## 2015-07-03 NOTE — Therapy (Signed)
[facility] [address] [place], [place], [address] Phone: [phone]   Fax:  [phone]  Physical Therapy Treatment  Patient Details  Name: [name] [name] MRN: [id number] Date of Birth: [birth year] No Data Recorded  Encounter Date: 07/03/2015      PT End of Session - 07/03/15 1451    Visit Number 7   Number of Visits 10   Date for PT Re-Evaluation 08/09/15   Authorization Type BCBS   Authorization Time Period 60 combined visits   PT Start Time 1400   PT Stop Time 1445   PT Time Calculation (min) 45 min   Equipment Utilized During Treatment Gait belt   Activity Tolerance Patient tolerated treatment well   Behavior During Therapy [facility] for tasks assessed/performed      Past Medical History  Diagnosis Date  . Hypertension   . ARF (acute renal failure) (HCC) 09/2014    admitted with electrolyte disturbance, ARF to [facility].   Marland Kitchen Positive H. pylori test 06/2014    serum H pylori + and treated.   . 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 Laparoscopic gastric sleeve resection  06/2014    Dr Olin Pia of [facility]  . Endometrial ablation w/ novasure    . Tubal ligation  2002  . Esophagogastroduodenoscopy  2016    Dr Mar[facility] K[facility]man of [facility]  found gastritis, erosion at bypass surgery site.     There were no vitals filed for this visit.  Visit Diagnosis:  Generalized weakness  Unsteadiness      Subjective Assessment - 07/03/15 1407    Subjective No new complaints. Pt stated she did work on updated HEP.    Currently in Pain? No/denies   Multiple Pain Sites No     Therapeutic Exercise: To increase total body strength and activity tolerance for pt's ADL's.  Seated on blue physioball 6in. From wall with alternating combo UE and LE lift. 1 set X20. Pt. Min. Guard with exercise for safety. 3 pound dumbell presses in  same position. 1 set X15 reps. Pt. Supervision with cues for scapular stabilization.  Pt supine on mat with red physioball. Reaching over head with ball in hand raising ball up to LE's with hips and knees flexed to 90 degrees on the way up then pt was able to straighten LE's and lower them. Ball stayed in hands for the duration of the exercise. 1 set X 20. Pt supervision with exercise. Cues for proper technique.  Alternate step ups on 8 inch box. 1 set X20 reps. Pt. Min. Guard assist for safety. L LE presents stronger than R LE with this exercise.  Neuro Re-ed: To improve pt's balance and reduce risk of falls.  On Trampoline with parallel bar assist: Overall she responded very well to these exercises. Increased balance noted with these activities today. Alternating single leg stance with opposite UE front raise. Pt. Min Guard assist. Pt able to complete 1 set X12 reps each leg without Parallel bar support.  Single leg stance with eyes closed. 2 sets. 30 sec. each leg, each set. Pt. Min Guard assist and intermittent parallel bar support.  Gait training: Pt. Completed 1020ft of gait in 6 min with supervision assist on paved surfaces and min guard assist with unlevel surfaces and with head turns and cognitive tasks. Pt also continued with 275ft of gait on indoor, level surfaces with  tossing ball up and continued cognitive tasks. Pt min guard due to challenges. (cognitive tasks such as naming animals a-z, questions on environment with scanning involvement, etc). Veering and slowed gait speed noted during cognitive tasks            PT Education - 07/03/15 1449    Education provided Yes   Education Details Pt. educated on safety with single leg stance balance at kitchen sink to continue progressing in righting ability and decreasing risk for falls.   Person(s) Educated Patient   Methods Explanation;Demonstration   Comprehension Verbalized understanding             PT Long Term Goals -  07/03/15 1435    PT LONG TERM GOAL #1   Title Pt will be independent with HEP for strengthening and balance to decrease fall risk and increase functional strength.  (Target Date: 07/08/15)   Time 4   Period Weeks   Status On-going   PT LONG TERM GOAL #2   Title Pt will increase FGA score to 29/30 in order to indicate improvement in functional balance and score within age matched normals.   (Target Date: 07/08/15)   Time 4   Period Weeks   Status On-going   PT LONG TERM GOAL #3   Title Pt will ambulate >1000' on outdoor surfaces (paved, grass, gravel, curb, ramp, etc) without AD at independent level to indicate safe negotiation in community.  (Target Date: 07/08/15)   Time 4   Period Weeks   Status On-going   PT LONG TERM GOAL #4   Title Pt will increase gait speed to 3.57 ft/sec to increase efficiency of gait to more normal walking speed of 4.37 ft/sec.   (Target Date: 07/08/15)   Time 4   Period Weeks   Status On-going           Plan - 07/03/15 1452    Clinical Impression Statement Pt. supervision with gait on level surfaces. Pt min guard with gait with head turning challenges and on unlevel surfaces.  Pt. completed 102500ft. walking distance in 6min. with mental and head turn challenges.Pt's static balance is improving (see neuro re-ed).   Pt will benefit from skilled therapeutic intervention in order to improve on the following deficits Abnormal gait;Impaired UE functional use;Decreased endurance   Rehab Potential Good   PT Frequency 2x / week   PT Duration 4 weeks   PT Treatment/Interventions Balance training;Therapeutic exercise;Gait training   PT Next Visit Plan Next session assess STG's. Continue to work on balance, generalized strengthening and activity tolerance.   PT Home Exercise Plan 06/15/15: side lying clamshell with red band, chair pose, quadruped alter. leg/arm raises, corner on pillows: narrow base EC, wide base EO head movements   Consulted and Agree with Plan of Care  Patient   Family Member Consulted Husband        Problem List Patient Active Problem List   Diagnosis Date Noted  . Calculus of gallbladder with acute on chronic cholecystitis without obstruction   . Leg weakness   . Adjustment disorder with mixed anxiety and depressed mood 11/30/2014  . FTT (failure to thrive) in adult   . Confusion   . Essential hypertension   . Sinus tachycardia (HCC)   . 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 Elevated LFTs 11/26/2014  . Obesity 11/26/2014  . Anxiety 11/26/2014  . Cholelithiasis 11/26/2014    Haze Justinerrick [place] 07/03/2015, 3:02 PM  Haze Justinerrick Tannor Pyon, SPTA  Name: Kizzie FantasiaChristel Hagerman MRN: [id number] Date of Birth:  01/22/1976  This note has been reviewed and edited by supervision CI.  Sallyanne Kuster, PTA, [facility] Outpatient Neuro [facility] [address] [place], [place] [address] [phone] 07/04/2015, 11:42 PM

## 2015-07-07 ENCOUNTER — Ambulatory Visit: Payer: BLUE CROSS/BLUE SHIELD

## 2015-07-07 ENCOUNTER — Ambulatory Visit: Payer: BLUE CROSS/BLUE SHIELD | Admitting: Physical Therapy

## 2015-07-07 NOTE — Therapy (Signed)
[facility] [address] [place], [place], [address] Phone: [phone]   Fax:  [phone]  Patient Details  Name: [name] FantasiaChristel Counterman MRN: [id number] Date of Birth: 06/20/1976 Referring Provider:  No ref. provider found  Encounter Date: 07/07/2015  Phoned pt's home number and spoke to husband due to no show to ST today. He phoned pt, and said she stated her ride who was supposed to pick her up did not come to do so.   Confirmed Friday appointment 8AM ST, 8:45 PT.  [facility],CARL , MS, CCC-SLP  07/07/2015, 3:21 PM  [facility] [address] [place], [place], [address] Phone: [phone]   Fax:  [phone]

## 2015-07-08 NOTE — Therapy (Deleted)
[facility] [address] [place], [place], [address] Phone: [phone]   Fax:  [phone]  Patient Details  Name: [name] FantasiaChristel Bice MRN: [id number] Date of Birth: 03/25/1976 Referring Provider:  Willow OraAndy, Camille L, MD  Encounter Date: 07/07/2015  Pt's husband stated via phone conversation that pt's ride did not come to pick her up for ST on 07-07-15. SLP confirmed appointment on 07-10-15.  [facility]L 07/08/2015, 8:55 AM  [facility] [address] [place], [place], [address] Phone: [phone]   Fax:  [phone]

## 2015-07-10 ENCOUNTER — Encounter: Payer: Self-pay | Admitting: Physical Therapy

## 2015-07-10 ENCOUNTER — Ambulatory Visit: Payer: BLUE CROSS/BLUE SHIELD

## 2015-07-10 ENCOUNTER — Ambulatory Visit: Payer: BLUE CROSS/BLUE SHIELD | Admitting: Physical Therapy

## 2015-07-10 DIAGNOSIS — R531 Weakness: Secondary | ICD-10-CM | POA: Diagnosis not present

## 2015-07-10 DIAGNOSIS — R41841 Cognitive communication deficit: Secondary | ICD-10-CM

## 2015-07-10 DIAGNOSIS — R2681 Unsteadiness on feet: Secondary | ICD-10-CM

## 2015-07-10 NOTE — Patient Instructions (Signed)
Feet Heel-Toe "Tandem" (Compliant Surface) Varied Arm Positions - Eyes Closed    Stand on compliant surface: __pillow___ with right foot directly in front of the other and arms out. Close eyes and visualize upright position. Hold_30 seconds. Repeat _3 times each foot forward.  1-2 times a day.Single Leg (Compliant Surface) - Eyes Closed   Single Leg (Compliant Surface) - Eyes Closed    While standing on left leg on compliant surface: __pillow____, close eyes and maintain balance. Hold__30__ seconds. Repeat __3__ times each leg. Do _1-2___ sessions per day.  Copyright  VHI. All rights reserved.

## 2015-07-10 NOTE — Therapy (Signed)
[facility] [address] [place], [place], [address] Phone: [phone]   Fax:  [phone]  Speech Language Pathology Treatment  Patient Details  Name: Nashly Olsson MRN: [id number] Date of Birth: November 07, 1975 No Data Recorded  Encounter Date: 07/10/2015      End of Session - 07/10/15 0906    Visit Number 5   Number of Visits 17   Date for SLP Re-Evaluation 08/10/15   SLP Start Time 0804   SLP Stop Time  0846   SLP Time Calculation (min) 42 min   Activity Tolerance Patient tolerated treatment well      Past Medical History  Diagnosis Date  . Hypertension   . ARF (acute renal failure) (HCC) 09/2014    admitted with electrolyte disturbance, ARF to [facility].   Marland Kitchen Positive H. pylori test 06/2014    serum H pylori + and treated.   . 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 Laparoscopic gastric sleeve resection  06/2014    Dr Olin Pia of [facility]  . Endometrial ablation w/ novasure    . Tubal ligation  2002  . Esophagogastroduodenoscopy  2016    Dr Mariana Kaufman of [facility].  found gastritis, erosion at bypass surgery site.     There were no vitals filed for this visit.  Visit Diagnosis: Cognitive communication deficit      Subjective Assessment - 07/10/15 0810    Subjective Pt using alarms to take medicines sucessfully. She brought in homework.               ADULT SLP TREATMENT - 07/10/15 0814    General Information   Behavior/Cognition Alert;Cooperative;Pleasant mood   Treatment Provided   Treatment provided Cognitive-Linquistic   Pain Assessment   Pain Assessment No/denies pain   Cognitive-Linquistic Treatment   Treatment focused on Cognition   Skilled Treatment Pt reports her time at home is going as well as can be expected given her memory deficits, but she is using comensations with her phone  (texting, reminders, alarms) successfully.Marland Kitchen SLP learned what pt does at work in order to tailor cognitive linguistic tasks to those activities and learned pt may not be going back to the exact position she had before. SLP offered vocational rehab referral and pt to think about it. Pt missed two errors on her homework - reduced emergent awareness.   Assessment / Recommendations / Plan   Plan Continue with current plan of care   Progression Toward Goals   Progression toward goals Progressing toward goals          SLP Education - 07/10/15 0906    Education provided Yes   Education Details vocational rehab referral is possible   Person(s) Educated Patient   Methods Explanation   Comprehension Verbalized understanding;Need further instruction          SLP Short Term Goals - 07/10/15 0927    SLP SHORT TERM GOAL #1   Title pt to record average 3 events daily in memory notebook, 4 days a week   Time 1   Period Weeks  or 8 visits   Status Deferred   SLP SHORT TERM GOAL #2   Title pt to recall pertinent information from therapy session after 10 minute delay using compensations except writing   Time 1   Period Weeks   Status On-going  SLP Long Term Goals - 07/10/15 [id number]    SLP LONG TERM GOAL #1   Title pt to record average 3 events in memory notebook/device at least 4 days per week 75% of opportunities (weeks)   Period --  or 16 visits   Status Deferred   SLP LONG TERM GOAL #2   Title pt will recall pertinent information after 15 minutes during therapy sessions using compensations (except writing)   Time 5   Period Weeks   Status On-going          Plan - 07/10/15 0907    Clinical Impression Statement Pt reports compensations are working very well at home. SLP offered referral to vocational rehab as pt is unsuer what position she will return to when/if she returns to work.    Speech Therapy Frequency 2x / week   Duration --  5 weeks   Treatment/Interventions  Compensatory strategies;Internal/external aids;Cognitive reorganization;SLP instruction and feedback;Patient/family education;Functional tasks   Potential to Achieve Goals Good   Potential Considerations Severity of impairments        Problem List Patient Active Problem List   Diagnosis Date Noted  . Calculus of gallbladder with acute on chronic cholecystitis without obstruction   . Leg weakness   . Adjustment disorder with mixed anxiety and depressed mood 11/30/2014  . FTT (failure to thrive) in adult   . Confusion   . Essential hypertension   . Sinus tachycardia (HCC)   . 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 Elevated LFTs 11/26/2014  . Obesity 11/26/2014  . Anxiety 11/26/2014  . Cholelithiasis 11/26/2014    [facility],Annarose Ouellet , MS, CCC-SLP  07/10/2015, 9:32 AM  [facility] [address] [place], [place], [address] Phone: [phone]   Fax:  [phone]   Name: Kizzie FantasiaChristel Syverson MRN: [id number] Date of Birth: 04/29/1976

## 2015-07-10 NOTE — Patient Instructions (Signed)
  Please complete the assigned speech therapy homework and return it to your next session.  

## 2015-07-10 NOTE — Therapy (Signed)
[facility] [address] [place], [place], [address] Phone: [phone]   Fax:  [phone]  Physical Therapy Treatment  Patient Details  Name: [name] [name] MRN: [id number] Date of Birth: [birth year] No Data Recorded  Encounter Date: 07/10/2015      PT End of Session - 07/10/15 0946    Visit Number 8   Number of Visits 10   Date for PT Re-Evaluation 08/09/15   Authorization Type BCBS   Authorization Time Period 60 combined visits   PT Start Time 0845   PT Stop Time 0930   PT Time Calculation (min) 45 min   Equipment Utilized During Treatment Gait belt   Activity Tolerance Patient tolerated treatment well   Behavior During Therapy [facility] for tasks assessed/performed      Past Medical History  Diagnosis Date  . Hypertension   . ARF (acute renal failure) ([place]) 09/2014    admitted with electrolyte disturbance, ARF to [facility].   Marland Kitchen Positive H. pylori test 06/2014    serum H pylori + and treated.   . 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 Endometrial ablation w/ novasure    . Tubal ligation  2002  . Esophagogastroduodenoscopy  2016    Dr Tora Duck of [facility].  found gastritis, erosion at bypass surgery site.     There were no vitals filed for this visit.  Visit Diagnosis:  Generalized weakness  Unsteadiness      Subjective Assessment - 07/10/15 0933    Subjective No new complaints. Pt reports adhering to HEP.   Limitations Walking   Currently in Pain? No/denies            [facility] PT Assessment - 07/10/15 0001    Functional Gait  Assessment   Gait Level Surface Walks 20 ft in less than 5.5 sec, no assistive devices, good speed, no evidence for imbalance, normal gait pattern, deviates no more than 6 in outside of the 12 in walkway  width.   Change in Gait Speed Able to smoothly change walking speed without loss of balance or gait deviation. Deviate no more than 6 in outside of the 12 in walkway width.   Gait with Horizontal Head Turns Performs head turns smoothly with no change in gait. Deviates no more than 6 in outside 12 in walkway width   Gait with Vertical Head Turns Performs head turns with no change in gait. Deviates no more than 6 in outside 12 in walkway width.   Gait and Pivot Turn Pivot turns safely in greater than 3 sec and stops with no loss of balance, or pivot turns safely within 3 sec and stops with mild imbalance, requires small steps to catch balance.   Step Over Obstacle Is able to step over 2 stacked shoe boxes taped together (9 in total height) without changing gait speed. No evidence of imbalance.   Gait with Narrow Base of Support Ambulates 7-9 steps.   Gait with Eyes Closed Walks 20 ft, slow speed, abnormal gait pattern, evidence for imbalance, deviates 10-15 in outside 12 in walkway width. Requires more than 9 sec to ambulate 20 ft.   Ambulating Backwards Walks 20 ft, uses assistive device, slower speed, mild gait deviations, deviates 6-10 in outside 12 in walkway width.   Steps Alternating  feet, must use rail.   Total Score 24   FGA comment: 19-24 = medium risk fall     NMR: To increase balance in static and dynamic activities in normal and low light conditions to reduce risk of falls. Cues to limit tactile feedback by touching or leaning on surrounding surfaces and for rest breaks upon fatigue. Bosu ball in parallel bars. 1 set 30 sec eyes open and 30 sec eyes closed. Corner balance with chair in front. Tandem and single leg stance. 3 sets each side, 30 sec each set (issued as HEP) Pt completed 261ft of gait with eyes closed with min guard assist and cues for direction.       [facility] Adult PT Treatment/Exercise - 07/10/15 0001    Ambulation/Gait   Ambulation/Gait Yes   Ambulation/Gait Assistance  5: Supervision   Ambulation/Gait Assistance Details Pt with ankle instability.   Ambulation Distance (Feet) 1000 Feet   Assistive device None   Gait Pattern Step-through pattern   Ambulation Surface Outdoor;Level;Unlevel;Paved;Gravel   Gait velocity 32.54ft/10sec= 3.59ft/sec   Stairs Yes   Stairs Assistance 5: Supervision   Stairs Assistance Details (indicate cue type and reason) cues for use of hand rail with reciprocal pattern when going down for safety due to instability noted   Stair Management Technique One rail Left;Alternating pattern   Number of Stairs 8   Height of Stairs 6   Ramp 5: Supervision   Ramp Details (indicate cue type and reason) none   Curb 5: Supervision          PT Education - 07/10/15 0945    Education provided Yes   Education Details Pt educated on balance HEP at home to increase balance when weight bearing in low light or decreased vision.    Person(s) Educated Patient   Methods Explanation;Demonstration   Comprehension Verbalized understanding;Returned demonstration          PT Long Term Goals - 07/10/15 0948    PT LONG TERM GOAL #1   Title Pt will be independent with HEP for strengthening and balance to decrease fall risk and increase functional strength.  (Target Date: 07/08/15)   Baseline achieved: 07/10/15   Time 4   Period Weeks   Status Achieved   PT LONG TERM GOAL #2   Title Pt will increase FGA score to 29/30 in order to indicate improvement in functional balance and score within age matched normals.   (Target Date: 07/08/15)   Time 4   Period Weeks   Status Not Met   PT LONG TERM GOAL #3   Title Pt will ambulate >1000' on outdoor surfaces (paved, grass, gravel, curb, ramp, etc) without AD at independent level to indicate safe negotiation in community.  (Target Date: 07/08/15)   Time 4   Period Weeks   Status Not Met   PT LONG TERM GOAL #4   Title Pt will increase gait speed to 3.57 ft/sec to increase efficiency of gait to more normal  walking speed of 4.37 ft/sec.   (Target Date: 07/08/15)   Time 4   Period Weeks   Status Not Met          Plan - 07/10/15 0947    Clinical Impression Statement Pt progressing towards LTG's. Pt's gait speed increased to 3.45ft/sec. Pt with minimal cues during gait. Pt POC is being extended to focus on static and dynamic balance especially in conditions with decreased vision to reduce risk of falls (PT to do renewal).   Pt will  benefit from skilled therapeutic intervention in order to improve on the following deficits Abnormal gait;Impaired UE functional use;Decreased endurance   Rehab Potential Good   PT Frequency 2x / week   PT Duration 4 weeks   PT Treatment/Interventions Balance training;Therapeutic exercise;Gait training   PT Next Visit Plan Focus on challenging balance with eyes closed static and dynamic with double and and single leg stance.   PT Home Exercise Plan 06/15/15: side lying clamshell with red band, chair pose, quadruped alter. leg/arm raises, corner on pillows: narrow base EC, wide base EO head movements   Consulted and Agree with Plan of Care Patient   Family Member Consulted Husband        Problem List Patient Active Problem List   Diagnosis Date Noted  . Calculus of gallbladder with acute on chronic cholecystitis without obstruction   . Leg weakness   . Adjustment disorder with mixed anxiety and depressed mood 11/30/2014  . FTT (failure to thrive) in adult   . Confusion   . Essential hypertension   . Sinus tachycardia ([place])   . 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 Elevated LFTs 11/26/2014  . Obesity 11/26/2014  . Anxiety 11/26/2014  . Cholelithiasis 11/26/2014    Laney Potash 07/10/2015, 3:08 PM  Laney Potash, [place]  Name: [name] [name] MRN: [id number] Date of Birth: [birth year]  This note has been reviewed and edited by supervising CI.  Willow Ora, PTA, [place] [address] [place], [place]  [phone] 07/10/2015, 3:18 PM

## 2015-07-13 ENCOUNTER — Encounter: Payer: Self-pay | Admitting: Physical Therapy

## 2015-07-13 ENCOUNTER — Ambulatory Visit: Payer: BLUE CROSS/BLUE SHIELD | Admitting: Physical Therapy

## 2015-07-13 DIAGNOSIS — R531 Weakness: Secondary | ICD-10-CM | POA: Diagnosis not present

## 2015-07-13 DIAGNOSIS — R2681 Unsteadiness on feet: Secondary | ICD-10-CM

## 2015-07-13 NOTE — Therapy (Signed)
[facility] [address] [place], [place], [address] Phone: [phone]   Fax:  [phone]  Physical Therapy Treatment  Patient Details  Name: [name] [name] MRN: [id number] Date of Birth: 11-07-1975 No Data Recorded  Encounter Date: 07/13/2015      PT End of Session - 07/13/15 1623    Visit Number 9   Number of Visits 10   Date for PT Re-Evaluation 08/09/15   Authorization Type BCBS   Authorization Time Period 60 combined visits   PT Start Time 1530   PT Stop Time 1615   PT Time Calculation (min) 45 min   Equipment Utilized During Treatment Gait belt   Activity Tolerance Patient tolerated treatment well   Behavior During Therapy [facility] for tasks assessed/performed      Past Medical History  Diagnosis Date  . Hypertension   . ARF (acute renal failure) ([place]) 09/2014    admitted with electrolyte disturbance, ARF to [facility].   Marland Kitchen Positive H. pylori test 06/2014    serum H pylori + and treated.   . 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 Endometrial ablation w/ novasure    . Tubal ligation  2002  . Esophagogastroduodenoscopy  2016    Dr Tora Duck of [facility].  found gastritis, erosion at bypass surgery site.     There were no vitals filed for this visit.  Visit Diagnosis:  Generalized weakness  Unsteadiness      Subjective Assessment - 07/13/15 1618    Subjective No new complaints.    Limitations Walking   Currently in Pain? No/denies   Multiple Pain Sites No     NMR: To increase stability in static and dynamic weight bearing to reduce risk of falls promote normal gait. Pt supervision on level and unlevel surfaces without challenges. Pt min guard with balance challenges for safety. Cues for decreased B knee flexion during  ambulation. Verbal and tactile cues for righting balance throughout treatment.  8 laps around track: 1. Normal pace to warm up body. 2. Slow pace for decreasing knee flexion and accentuating dorsiflexion with gait. 3. Tandem walking on brown rubber around track. 1/2 lap eyes closed, and 1/2 lap eyes open. 4. Tandem walking around track with eyes open. 5. Walking with eyes shut.  Single leg stance on BOSU in parallel bars with mirror for visual feedback: 4 sets, 30 sec each, one set eyes open, and one set eyes closed on each stance leg. Tandem stance on BOSU in parallel bars with mirror for visual feedback: 4 sets, 30 sec each, one set eyes open and one set eyes closed on each stance leg.  1065f of ambulation on outdoor, unlevel surfaces with intermittent balance challenges. Each challenge took place for 480f  1. Normal pace eyes open in grass. 2. Slow pace with accentuated dorsi flexion and decreased knee flexion in grass. 3. Slow pace eyes closed in grass. 4. Marching in grass. 5. Walking backward in grass with eyes open. 6. Up and down incline in grass.       PT Education - 07/13/15 1620    Education provided Yes   Education Details Continued education on balance challenges with eyes closed to challenge vestibualar and proprioceptive systems.   Person(s) Educated Patient   Methods Explanation   Comprehension  Verbalized understanding             PT Long Term Goals - 07/13/15 1630    PT LONG TERM GOAL #1   Title Pt will be independent with HEP for strengthening and balance to decrease fall risk and increase functional strength.  (Target Date: 07/08/15)   Baseline achieved: 07/10/15   Time 4   Period Weeks   Status Achieved   PT LONG TERM GOAL #2   Title Pt will increase FGA score to 29/30 in order to indicate improvement in functional balance and score within age matched normals.   (Target Date: 07/08/15)   Time 4   Period Weeks   Status Not Met   PT LONG TERM GOAL #3    Title Pt will ambulate >1000' on outdoor surfaces (paved, grass, gravel, curb, ramp, etc) without AD at independent level to indicate safe negotiation in community.  (Target Date: 07/08/15)   Time 4   Period Weeks   Status Not Met   PT LONG TERM GOAL #4   Title Pt will increase gait speed to 3.57 ft/sec to increase efficiency of gait to more normal walking speed of 4.37 ft/sec.   (Target Date: 07/08/15)   Time 4   Period Weeks   Status Not Met            Plan - 07/13/15 1624    Clinical Impression Statement Pt progressing towards LTG's. Pt progressing in dynamic balance and accepting new challenges. Pt presents with excess knee flexion in gait with dorsiflexors WNL. The gait deviation could be due to prior dorsiflexion weakness.    Pt will benefit from skilled therapeutic intervention in order to improve on the following deficits Abnormal gait;Impaired UE functional use;Decreased endurance   Rehab Potential Good   PT Frequency 2x / week   PT Duration 4 weeks   PT Treatment/Interventions Balance training;Therapeutic exercise;Gait training   PT Next Visit Plan Next session continue progressing balance in oder to reduce risk of falls during gait and ADL's   PT Home Exercise Plan 06/15/15: side lying clamshell with red band, chair pose, quadruped alter. leg/arm raises, corner on pillows: narrow base EC, wide base EO head movements   Consulted and Agree with Plan of Care Patient   Family Member Consulted Husband        Problem List Patient Active Problem List   Diagnosis Date Noted  . Calculus of gallbladder with acute on chronic cholecystitis without obstruction   . Leg weakness   . Adjustment disorder with mixed anxiety and depressed mood 11/30/2014  . FTT (failure to thrive) in adult   . Confusion   . Essential hypertension   . Sinus tachycardia ([place])   . 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 Elevated LFTs 11/26/2014  . Obesity 11/26/2014  . Anxiety 11/26/2014  . Cholelithiasis 11/26/2014     [name] [name] 07/13/2015, 4:31 PM  [name] [name], [place]  Name: [name] [name] MRN: [id number] Date of Birth: 1975-11-08  This note has been reviewed and edited by supervising CI.  Willow Ora, PTA, [place] [address], [place],  [phone] 07/14/2015, 9:38 AM

## 2015-07-13 NOTE — Addendum Note (Signed)
Addended by: Harriet ButtePARCELL, Fatuma Dowers A on: 07/13/2015 04:48 PM   Modules accepted: Orders

## 2015-07-15 ENCOUNTER — Ambulatory Visit: Payer: BLUE CROSS/BLUE SHIELD | Admitting: Physical Therapy

## 2015-07-15 ENCOUNTER — Ambulatory Visit: Payer: BLUE CROSS/BLUE SHIELD

## 2015-07-15 DIAGNOSIS — R531 Weakness: Secondary | ICD-10-CM | POA: Diagnosis not present

## 2015-07-15 DIAGNOSIS — R41841 Cognitive communication deficit: Secondary | ICD-10-CM

## 2015-07-15 NOTE — Therapy (Signed)
[facility] [address] [place], [place], [address] Phone: [phone]   Fax:  [phone]  Speech Language Pathology Treatment  Patient Details  Name: [name] [name] MRN: [id number] Date of Birth: 11/20/1975 No Data Recorded  Encounter Date: 07/15/2015      End of Session - 07/15/15 1642    Visit Number 6   Number of Visits 17   Date for SLP Re-Evaluation 08/10/15   SLP Start Time 1449   SLP Stop Time  1530   SLP Time Calculation (min) 41 min   Activity Tolerance Patient tolerated treatment well      Past Medical History  Diagnosis Date  . Hypertension   . ARF (acute renal failure) (HCC) 09/2014    admitted with electrolyte disturbance, ARF to [facility].   . Positive H. pylori test 06/2014    serum H pylori + and treated.   . 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 Laparoscopic gastric sleeve resection  06/2014    Dr James Dasher of [facility]  . Endometrial ablation w/ novasure    . Tubal ligation  2002  . Esophagogastroduodenoscopy  2016    Dr William Bray of [facility].  found gastritis, erosion at bypass surgery site.     There were no vitals filed for this visit.  Visit Diagnosis: Cognitive communication deficit             ADULT SLP TREATMENT - 07/15/15 1453    General Information   Behavior/Cognition Alert;Cooperative;Pleasant mood   Treatment Provided   Treatment provided Cognitive-Linquistic   Pain Assessment   Pain Assessment No/denies pain   Cognitive-Linquistic Treatment   Treatment focused on Cognition   Skilled Treatment Pt reports her time at home cont as good given her modifications for reduced memory. Pt's homework completed without errors. In divided attention tasks (auditory and written) pt success 90%, and 92% respecitvely.   Assessment / Recommendations / Plan   Plan Continue with  current plan of care   Progression Toward Goals   Progression toward goals Progressing toward goals            SLP Short Term Goals - 07/15/15 1651    SLP SHORT TERM GOAL #1   Title pt to record average 3 events daily in memory notebook, 4 days a week   Time 1   Period Weeks  or 8 visits   Status Deferred   SLP SHORT TERM GOAL #2   Title pt to recall pertinent information from therapy session after 10 minute delay using compensations except writing   Time 1   Period Weeks   Status Partially Met          SLP Long Term Goals - 07/15/15 1651    SLP LONG TERM GOAL #1   Title pt to record average 3 events in memory notebook/device at least 4 days per week 75% of opportunities (weeks)   Period --  or 16 visits   Status Deferred   SLP LONG TERM GOAL #2   Title pt will recall pertinent information after 15 minutes during therapy sessions using compensations (except writing)   Time 5   Period Weeks   Status On-going   SLP LONG TERM GOAL #3   Title pt will demo divided attention in mod complex cognitive linguistic tasks with 90% success over three sessions   Time 5     Period Weeks   Status New          Plan - 07/15/15 1643    Clinical Impression Statement Pt reports compensations cont working very well at home. Pt better with emergent awareness today than last week - homework was correct today.    Speech Therapy Frequency 2x / week   Duration 4 weeks   Treatment/Interventions Compensatory strategies;Internal/external aids;Cognitive reorganization;SLP instruction and feedback;Patient/family education;Functional tasks   Potential to Achieve Goals Good   Potential Considerations Severity of impairments        Problem List Patient Active Problem List   Diagnosis Date Noted  . Calculus of gallbladder with acute on chronic cholecystitis without obstruction   . Leg weakness   . Adjustment disorder with mixed anxiety and depressed mood 11/30/2014  . FTT (failure to  thrive) in adult   . Confusion   . Essential hypertension   . Sinus tachycardia ([place])   . 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 Elevated LFTs 11/26/2014  . Obesity 11/26/2014  . Anxiety 11/26/2014  . Cholelithiasis 11/26/2014    [facility] , [place], [place] 07/15/2015, 4:56 PM  [facility] [address] [place], [place], [address] Phone: [phone]   Fax:  [phone]   Name: [name] [name] MRN: [id number] Date of Birth: 1976-07-08

## 2015-07-15 NOTE — Patient Instructions (Signed)
   At the same time as the word searches, watch a TV show and during the commercials, tell the person you watch with what the plot of the show is so far.

## 2015-07-17 ENCOUNTER — Ambulatory Visit: Payer: BLUE CROSS/BLUE SHIELD

## 2015-07-17 DIAGNOSIS — R531 Weakness: Secondary | ICD-10-CM | POA: Diagnosis not present

## 2015-07-17 DIAGNOSIS — R41841 Cognitive communication deficit: Secondary | ICD-10-CM

## 2015-07-17 NOTE — Therapy (Signed)
[facility] [address] [place], [place], [address] Phone: [phone]   Fax:  [phone]  Speech Language Pathology Treatment  Patient Details  Name: [name] [name] MRN: [id number] Date of Birth: 04/03/1976 No Data Recorded  Encounter Date: 07/17/2015      End of Session - 07/17/15 1626    Visit Number 7   Number of Visits 17   Date for SLP Re-Evaluation 08/10/15   SLP Start Time 1532   SLP Stop Time  5449   SLP Time Calculation (min) 42 min   Activity Tolerance Patient tolerated treatment well      Past Medical History  Diagnosis Date  . Hypertension   . ARF (acute renal failure) ([place]) 09/2014    admitted with electrolyte disturbance, ARF to [facility].   Marland Kitchen Positive H. pylori test 06/2014    serum H pylori + and treated.   . 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 Endometrial ablation w/ novasure    . Tubal ligation  2002  . Esophagogastroduodenoscopy  2016    Dr Tora Duck of [facility].  found gastritis, erosion at bypass surgery site.     There were no vitals filed for this visit.  Visit Diagnosis: Cognitive communication deficit             ADULT SLP TREATMENT - 07/17/15 1537    General Information   Behavior/Cognition Alert;Cooperative;Pleasant mood   Treatment Provided   Treatment provided Cognitive-Linquistic   Pain Assessment   Pain Assessment No/denies pain   Cognitive-Linquistic Treatment   Treatment focused on Cognition   Skilled Treatment SLP facilitated therapy for compensations for memory today with pt, per her choice (vs. attention tasks). SLP used delayed recall with pt practicing compensations of writing and repetition as well as mental picture. SLP assessed pt's best strategy as writing, and then  repetition with shorter verbal stimuli. Pt did not fare well with mental picture strategy. She was distracted by other auditory information in the hallway, so decr'd selective attention noted. SLP educated pt re: what compensations she could use for improving attention - she generated shutting the door and "concentrating better."   Assessment / Recommendations / Plan   Plan Continue with current plan of care   Progression Toward Goals   Progression toward goals Progressing toward goals          SLP Education - 07/17/15 1625    Education provided Yes   Education Details cont education re: memory and atteniton Therapist, sports) Educated Patient   Methods Explanation   Comprehension Verbalized understanding;Returned demonstration          SLP Short Term Goals - 07/15/15 1651    SLP SHORT TERM GOAL #1   Title pt to record average 3 events daily in memory notebook, 4 days a week   Time 1   Period Weeks  or 8 visits   Status Deferred   SLP SHORT TERM GOAL #2   Title pt to recall pertinent information from therapy session after 10 minute delay using compensations except writing   Time 1   Period Weeks   Status Partially Met          SLP Long Term Goals - 07/17/15 1628    SLP LONG TERM GOAL #1  Title pt to record average 3 events in memory notebook/device at least 4 days per week 75% of opportunities (weeks)   Period --  or 16 visits   Status Deferred   SLP LONG TERM GOAL #2   Title pt will recall pertinent information after 15 minutes during therapy sessions using compensations (except writing)   Time 5   Period Weeks   Status On-going   SLP LONG TERM GOAL #3   Title pt will demo divided attention in mod complex cognitive linguistic tasks with 90% success over three sessions   Time 5   Period Weeks   Status New          Plan - 07/17/15 1626    Clinical Impression Statement Pt reports compensations cont working very well at home. Pt's best strategy for  memory compensation is writing down information. Attention deficits hinder pt's memory abilities. Skilled St remains needed to work with pt's attention skills primarily, and to cont to foster pt's compensations for memory.   Speech Therapy Frequency 2x / week   Duration 4 weeks   Treatment/Interventions Compensatory strategies;Internal/external aids;Cognitive reorganization;SLP instruction and feedback;Patient/family education;Functional tasks   Potential to Achieve Goals Good   Potential Considerations Severity of impairments        Problem List Patient Active Problem List   Diagnosis Date Noted  . Calculus of gallbladder with acute on chronic cholecystitis without obstruction   . Leg weakness   . Adjustment disorder with mixed anxiety and depressed mood 11/30/2014  . FTT (failure to thrive) in adult   . Confusion   . Essential hypertension   . Sinus tachycardia ([place])   . 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 Elevated LFTs 11/26/2014  . Obesity 11/26/2014  . Anxiety 11/26/2014  . Cholelithiasis 11/26/2014    SCHINKE,CARL , [place], [place]  07/17/2015, 4:29 PM  [facility] [address] [place], [place], [address] Phone: [phone]   Fax:  [phone]   Name: [name] [name] MRN: [id number] Date of Birth: 03/03/1976

## 2015-07-17 NOTE — Patient Instructions (Signed)
  Please complete the assigned speech therapy homework and return it to your next session.  

## 2015-07-20 ENCOUNTER — Ambulatory Visit: Payer: BLUE CROSS/BLUE SHIELD | Admitting: Speech Pathology

## 2015-07-20 DIAGNOSIS — R41841 Cognitive communication deficit: Secondary | ICD-10-CM

## 2015-07-20 DIAGNOSIS — R531 Weakness: Secondary | ICD-10-CM | POA: Diagnosis not present

## 2015-07-20 NOTE — Patient Instructions (Signed)
  Use external reminders such as loose rubber band around your wrist, switch your watch to the other hand, draw a dot on your hand - to help you remember an important task/message

## 2015-07-20 NOTE — Therapy (Signed)
[facility] [address] [place], [place], [address] Phone: [phone]   Fax:  [phone]  Speech Language Pathology Treatment  Patient Details  Name: [name] [name] MRN: [id number] Date of Birth: 1975/11/23 No Data Recorded  Encounter Date: 07/20/2015      End of Session - 07/20/15 1450    Visit Number 8   Number of Visits 17   Date for SLP Re-Evaluation 08/10/15   SLP Start Time 5053   SLP Stop Time  1446   SLP Time Calculation (min) 43 min   Activity Tolerance Patient tolerated treatment well      Past Medical History  Diagnosis Date  . Hypertension   . ARF (acute renal failure) ([place]) 09/2014    admitted with electrolyte disturbance, ARF to [facility].   Marland Kitchen Positive H. pylori test 06/2014    serum H pylori + and treated.   . 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 Endometrial ablation w/ novasure    . Tubal ligation  2002  . Esophagogastroduodenoscopy  2016    Dr Tora Duck of [facility].  found gastritis, erosion at bypass surgery site.     There were no vitals filed for this visit.  Visit Diagnosis: Cognitive communication deficit      Subjective Assessment - 07/20/15 1404    Subjective "I didn't bring my homework b/c I had an appontment earlier"               ADULT SLP TREATMENT - 07/20/15 1404    General Information   Behavior/Cognition Alert;Cooperative;Pleasant mood   Treatment Provided   Treatment provided Cognitive-Linquistic   Pain Assessment   Pain Assessment No/denies pain   Cognitive-Linquistic Treatment   Treatment focused on Cognition   Skilled Treatment Pt reports she is using memory notebook at home to assist with tasks, lists and important information. Pt answered questions re: her upcoming schedule  using her phone calendar with min A. Divided attention facilitated with card sort while pt verbalized bills and coins that added up to an amount called out by ST with extedned time for card sort - with 90% accuracy . She required occasional minimal repeptition of money amounts with occasional min reuqest for repeat. She also required occasional min repetition of rules for card sort, despite visual cues of rules for each pile  of cards.    Assessment / Recommendations / Plan   Plan Continue with current plan of care   Progression Toward Goals   Progression toward goals Progressing toward goals            SLP Short Term Goals - 07/20/15 1449    SLP SHORT TERM GOAL #1   Title pt to record average 3 events daily in memory notebook, 4 days a week   Time 1   Period Weeks  or 8 visits   Status Deferred   SLP SHORT TERM GOAL #2   Title pt to recall pertinent information from therapy session after 10 minute delay using compensations except writing   Time 1   Period Weeks   Status Partially Met          SLP Long Term Goals - 07/20/15 1450    SLP LONG TERM GOAL #1   Title pt to record average 3 events  in memory notebook/device at least 4 days per week 75% of opportunities (weeks)   Time 5   Period --  or 16 visits   Status Deferred   SLP LONG TERM GOAL #2   Title pt will recall pertinent information after 15 minutes during therapy sessions using compensations (except writing)   Time 4   Period Weeks   Status On-going   SLP LONG TERM GOAL #3   Title pt will demo divided attention in mod complex cognitive linguistic tasks with 90% success over three sessions   Time 4   Period Weeks   Status New          Plan - 07/20/15 1449    Clinical Impression Statement Pt reports compensations cont working very well at home. Pt's best strategy for memory compensation is writing down information. Attention deficits hinder pt's memory abilities. Skilled St remains needed to work with pt's  attention skills primarily, and to cont to foster pt's compensations for memory.   Speech Therapy Frequency 2x / week   Treatment/Interventions Compensatory strategies;Internal/external aids;Cognitive reorganization;SLP instruction and feedback;Patient/family education;Functional tasks   Potential to Achieve Goals Good   Potential Considerations Severity of impairments        Problem List Patient Active Problem List   Diagnosis Date Noted  . Calculus of gallbladder with acute on chronic cholecystitis without obstruction   . Leg weakness   . Adjustment disorder with mixed anxiety and depressed mood 11/30/2014  . FTT (failure to thrive) in adult   . Confusion   . Essential hypertension   . Sinus tachycardia ([place])   . 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 Elevated LFTs 11/26/2014  . Obesity 11/26/2014  . Anxiety 11/26/2014  . Cholelithiasis 11/26/2014    Quintara Bost, Annye Rusk MS, CCC-SLP 07/20/2015, 2:51 PM  [facility] [address] [place], [place], [address] Phone: [phone]   Fax:  [phone]   Name: [name] [name] MRN: [id number] Date of Birth: 28-Mar-1976

## 2015-07-21 ENCOUNTER — Ambulatory Visit: Payer: BLUE CROSS/BLUE SHIELD | Admitting: Rehabilitation

## 2015-07-27 ENCOUNTER — Encounter: Payer: Self-pay | Admitting: Physical Therapy

## 2015-07-27 ENCOUNTER — Ambulatory Visit: Payer: BLUE CROSS/BLUE SHIELD | Admitting: Physical Therapy

## 2015-07-27 DIAGNOSIS — R531 Weakness: Secondary | ICD-10-CM

## 2015-07-27 DIAGNOSIS — R2681 Unsteadiness on feet: Secondary | ICD-10-CM

## 2015-07-27 NOTE — Therapy (Signed)
[facility] [address] [place], [place], [address] Phone: [phone]   Fax:  [phone]  Physical Therapy Treatment  Patient Details  Name: [name] [name] MRN: [id number] Date of Birth: [birth year] No Data Recorded  Encounter Date: 07/27/2015   07/27/15 0905  PT Visits / Re-Eval  Visit Number 10  Number of Visits 18  Authorization  Authorization Type [employer]  Authorization Time Period 60 combined visits  PT Time Calculation  PT Start Time 0820  PT Stop Time 0905  PT Time Calculation (min) 45 min  PT - End of Session  Equipment Utilized During Treatment Gait belt  Activity Tolerance Patient tolerated treatment well     Past Medical History  Diagnosis Date  . Hypertension   . ARF (acute renal failure) (HCC) 09/2014    admitted with electrolyte disturbance, ARF to [facility].   Marland Kitchen Positive H. pylori test 06/2014    serum H pylori + and treated.   . 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 Laparoscopic gastric sleeve resection  06/2014    Dr Olin Pia of [facility]  . Endometrial ablation w/ novasure    . Tubal ligation  2002  . Esophagogastroduodenoscopy  2016    Dr Mariana Kaufman of [facility].  found gastritis, erosion at bypass surgery site.     There were no vitals filed for this visit.  Visit Diagnosis:  Generalized weakness  Unsteadiness      Subjective Assessment - 07/27/15 0902    Subjective No new complaints or changes in status reported by pt.   Limitations Walking   Currently in Pain? No/denies   Multiple Pain Sites No            [facility] PT Assessment - 07/27/15 0826    Functional Gait  Assessment   Gait assessed  Yes   Gait Level Surface Walks 20 ft in less than 5.5 sec, no assistive devices, good speed, no evidence for imbalance, normal gait pattern, deviates no more than 6 in outside of the 12 in  walkway width.   Change in Gait Speed Able to smoothly change walking speed without loss of balance or gait deviation. Deviate no more than 6 in outside of the 12 in walkway width.   Gait with Horizontal Head Turns Performs head turns smoothly with slight change in gait velocity (eg, minor disruption to smooth gait path), deviates 6-10 in outside 12 in walkway width, or uses an assistive device.   Gait with Vertical Head Turns Performs task with slight change in gait velocity (eg, minor disruption to smooth gait path), deviates 6 - 10 in outside 12 in walkway width or uses assistive device   Gait and Pivot Turn Pivot turns safely within 3 sec and stops quickly with no loss of balance.   Step Over Obstacle Is able to step over 2 stacked shoe boxes taped together (9 in total height) without changing gait speed. No evidence of imbalance.   Gait with Narrow Base of Support Is able to ambulate for 10 steps heel to toe with no staggering.   Gait with Eyes Closed Walks 20 ft, no assistive devices, good speed, no evidence of imbalance, normal gait pattern, deviates no more than 6 in outside 12 in walkway width. Ambulates 20 ft in less than 7 sec.   Ambulating Backwards Walks 20 ft, no assistive devices, good speed, no evidence  for imbalance, normal gait   Steps Alternating feet, no rail.   Total Score 28        NMR: To increase pt's functional independence by increasing balance and decreasing risk of falls. Pt min guard assist with static and dynamic balance challenges. Mirror was used for visual feedback with parallel bar activities.  1. Single leg stance on Bosu in parallel bars: 1, 60 sec rep each leg.  2. Double leg stance on Bosu in parallel bars: 1, 60 sec rep with normal BOS and 1, 60 sec rep with narrow BOS.  3. Tandem walking on blue foam beam in parallel bars: 3 laps down and back.  4. Pt ambulated 345 ft on level surfaces with balance challenges of catching 2lb ball, hurdles, closing  eyes.  5. Walking Lunges: 1 set X10 reps each leg. Pt min guard assist due to instability.        PT Education - 07/27/15 0903    Education provided Yes   Education Details Pt educated on walking lunge technique to strengthen LE's and to increase stability in LE's. Pt educated to try these in her hallway at home for safety.   Person(s) Educated Patient   Methods Explanation;Demonstration   Comprehension Verbalized understanding;Returned demonstration         PT Long Term Goals - 07/27/15 0907    PT LONG TERM GOAL #1   Title Pt will be independent with HEP for strengthening and balance to decrease fall risk and increase functional strength.  (Target Date: 07/08/15)   Baseline achieved: 07/10/15   Time 4   Period Weeks   Status Achieved   PT LONG TERM GOAL #2   Title Pt will increase FGA score to 29/30 in order to indicate improvement in functional balance and score within age matched normals.   (New target Date: 08/09/15)   Baseline Assessed 07/27/15: pt scored 28/30 today   Time 4   Period Weeks   Status On-going   PT LONG TERM GOAL #3   Title Pt will ambulate >1000' on outdoor surfaces (paved, grass, gravel, curb, ramp, etc) without AD at independent level to indicate safe negotiation in community.  (New target Date: 08/09/15)   Baseline Achievd: 07/27/15   Time 4   Period Weeks   Status Achieved   PT LONG TERM GOAL #4   Title Pt will increase gait speed to 3.57 ft/sec to increase efficiency of gait to more normal walking speed of 4.37 ft/sec.   (New target Date: 08/09/15   Baseline Assessed 07/27/15: Pt's gait speed has increased to 4.827ft/sec.   Time 4   Period Weeks   Status On-going            Plan - 07/27/15 0906    Clinical Impression Statement Pt is very close to achieving unmet goals. Pt presents with veering with head turns during gait. Pt's gait speed is slightly below goal (see LTG's). Pt mod I for gait on level and unlevel surfaces without balance  challenges. Pt's balance presents impaired with distractions and challenges during gait.   Pt will benefit from skilled therapeutic intervention in order to improve on the following deficits Abnormal gait;Impaired UE functional use;Decreased endurance   Rehab Potential Good   PT Frequency 2x / week   PT Duration 4 weeks   PT Treatment/Interventions Balance training;Therapeutic exercise;Gait training   PT Next Visit Plan Next session continue progressing balance in order to reduce risk of falls during gait and ADL's with an emphasis  on unmet LTG's of scanning with gait and increasing gait speed.   PT Home Exercise Plan 06/15/15: side lying clamshell with red band, chair pose, quadruped alter. leg/arm raises, corner on pillows: narrow base EC, wide base EO head movements   Consulted and Agree with Plan of Care Patient   Family Member Consulted Husband        Problem List Patient Active Problem List   Diagnosis Date Noted  . Calculus of gallbladder with acute on chronic cholecystitis without obstruction   . Leg weakness   . Adjustment disorder with mixed anxiety and depressed mood 11/30/2014  . FTT (failure to thrive) in adult   . Confusion   . Essential hypertension   . Sinus tachycardia ([facility])   . 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 Elevated LFTs 11/26/2014  . Obesity 11/26/2014  . Anxiety 11/26/2014  . Cholelithiasis 11/26/2014    Haze Justin 07/28/2015, 2:04 PM  Haze Justin, SPTA  Name: [name] [name] MRN: [id number] Date of Birth: Sep 14, 1975  This note has been reviewed and edited by supervising CI.  Sallyanne Kuster, PTA, [facility] [address] [place], [place] [address] [phone] 07/28/2015, 9:56 PM

## 2015-07-28 ENCOUNTER — Ambulatory Visit: Payer: BLUE CROSS/BLUE SHIELD

## 2015-07-28 DIAGNOSIS — R41841 Cognitive communication deficit: Secondary | ICD-10-CM

## 2015-07-28 DIAGNOSIS — R531 Weakness: Secondary | ICD-10-CM | POA: Diagnosis not present

## 2015-07-28 NOTE — Therapy (Signed)
[place] [address] [place], [place], [address] Phone: [phone]   Fax:  [phone]  Speech Language Pathology Treatment  Patient Details  Name: [name] [name] MRN: [id number] Date of Birth: 1976/05/10 No Data Recorded  Encounter Date: 07/28/2015      End of Session - 07/28/15 1647    Visit Number 9   Number of Visits 17   Date for SLP Re-Evaluation 08/10/15   Authorization - Visit Number 8   Authorization - Number of Visits 6   SLP Start Time 9758   SLP Stop Time  1619   SLP Time Calculation (min) 41 min   Activity Tolerance Patient tolerated treatment well      Past Medical History  Diagnosis Date  . Hypertension   . ARF (acute renal failure) ([place]) 09/2014    admitted with electrolyte disturbance, ARF to [facility].   Marland Kitchen Positive H. pylori test 06/2014    serum H pylori + and treated.   . 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 Endometrial ablation w/ novasure    . Tubal ligation  2002  . Esophagogastroduodenoscopy  2016    Dr Tora Duck of [facility]  found gastritis, erosion at bypass surgery site.     There were no vitals filed for this visit.  Visit Diagnosis: Cognitive communication deficit      Subjective Assessment - 07/28/15 1553    Subjective Pt reports compensations are continuing to work very well for memory and pt would like to cont to work on attention/focus.               ADULT SLP TREATMENT - 07/28/15 1554    General Information   Behavior/Cognition Alert;Cooperative;Pleasant mood   Treatment Provided   Treatment provided Cognitive-Linquistic   Pain Assessment   Pain Assessment No/denies pain   Cognitive-Linquistic Treatment   Treatment focused on Cognition   Skilled Treatment Pt recalled many  details from the weekend. In divided attention tasks, pt demo'd divided attention 20% of the time, adn reported she would have been able to divide attention with >50% of tasks, for sure, prior to hospitalization. Pt req'd usual min A for repeats of auditory information and extra time to complete auditory information asked of her.   Assessment / Recommendations / Plan   Plan Continue with current plan of care   Progression Toward Goals   Progression toward goals Progressing toward goals            SLP Short Term Goals - 07/20/15 1449    SLP SHORT TERM GOAL #1   Title pt to record average 3 events daily in memory notebook, 4 days a week   Time 1   Period Weeks  or 8 visits   Status Deferred   SLP SHORT TERM GOAL #2   Title pt to recall pertinent information from therapy session after 10 minute delay using compensations except writing   Time 1   Period Weeks   Status Partially Met          SLP Long Term Goals - 07/28/15 1649    SLP LONG TERM GOAL #1   Title pt to record average 3 events in memory notebook/device at least 4 days per week 75% of opportunities (weeks)   Time 5  Period --  or 16 visits   Status Deferred   SLP LONG TERM GOAL #2   Title pt will recall pertinent information after 15 minutes during therapy sessions using compensations (except writing)   Time 8   Period --  visits   Status On-going   SLP LONG TERM GOAL #3   Title pt will demo divided attention in mod complex cognitive linguistic tasks with 90% success over three sessions   Time 8   Period --  visits   Status On-going          Plan - 07/28/15 1647    Clinical Impression Statement Divided attention continues to require SLP A. Skilled ST needed to maximize cognitive linguistic skills.   Speech Therapy Frequency 2x / week   Duration 2 weeks  or 8 more visits   Treatment/Interventions Compensatory strategies;Internal/external aids;Cognitive reorganization;SLP instruction and  feedback;Patient/family education;Functional tasks   Potential to Achieve Goals Fair   Potential Considerations Severity of impairments        Problem List Patient Active Problem List   Diagnosis Date Noted  . Calculus of gallbladder with acute on chronic cholecystitis without obstruction   . Leg weakness   . Adjustment disorder with mixed anxiety and depressed mood 11/30/2014  . FTT (failure to thrive) in adult   . Confusion   . Essential hypertension   . Sinus tachycardia ([place])   . 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 Elevated LFTs 11/26/2014  . Obesity 11/26/2014  . Anxiety 11/26/2014  . Cholelithiasis 11/26/2014    [facility] , [place], [place]  07/28/2015, 4:50 PM  [place] [address] [place], [place], [address] Phone: [phone]   Fax:  [phone]   Name: [name] [name] MRN: [id number] Date of Birth: 05-Mar-1976

## 2015-07-28 NOTE — Patient Instructions (Signed)
  Please complete the assigned speech therapy homework and return it to your next session.  

## 2015-07-29 ENCOUNTER — Ambulatory Visit: Payer: Self-pay | Admitting: Physical Therapy

## 2015-07-31 ENCOUNTER — Ambulatory Visit: Payer: BLUE CROSS/BLUE SHIELD | Attending: Family Medicine

## 2015-07-31 ENCOUNTER — Ambulatory Visit: Payer: BLUE CROSS/BLUE SHIELD | Admitting: Physical Therapy

## 2015-07-31 ENCOUNTER — Encounter: Payer: Self-pay | Admitting: Physical Therapy

## 2015-07-31 DIAGNOSIS — R531 Weakness: Secondary | ICD-10-CM

## 2015-07-31 DIAGNOSIS — R2681 Unsteadiness on feet: Secondary | ICD-10-CM

## 2015-07-31 DIAGNOSIS — R41841 Cognitive communication deficit: Secondary | ICD-10-CM | POA: Diagnosis not present

## 2015-07-31 NOTE — Therapy (Signed)
[place] [address] [place], [place], [address] Phone: [phone]   Fax:  [phone]  Speech Language Pathology Treatment  Patient Details  Name: [name] [name] MRN: [id number] Date of Birth: May 23, 1976 No Data Recorded  Encounter Date: 07/31/2015      End of Session - 07/31/15 1645    Visit Number 10   Number of Visits 17   Date for SLP Re-Evaluation 08/10/15   Authorization - Visit Number 9   Authorization - Number of Visits 34   SLP Start Time 1025   SLP Stop Time  8527   SLP Time Calculation (min) 38 min   Activity Tolerance Patient tolerated treatment well      Past Medical History  Diagnosis Date  . Hypertension   . ARF (acute renal failure) ([place]) 09/2014    admitted with electrolyte disturbance, ARF to [facility].   Marland Kitchen Positive H. pylori test 06/2014    serum H pylori + and treated.   . 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 Endometrial ablation w/ novasure    . Tubal ligation  2002  . Esophagogastroduodenoscopy  2016    Dr Tora Duck of [facility].  found gastritis, erosion at bypass surgery site.     There were no vitals filed for this visit.  Visit Diagnosis: Cognitive communication deficit             ADULT SLP TREATMENT - 07/31/15 1601    General Information   Behavior/Cognition Alert;Cooperative;Pleasant mood   Treatment Provided   Treatment provided Cognitive-Linquistic   Pain Assessment   Pain Assessment No/denies pain   Cognitive-Linquistic Treatment   Treatment focused on Cognition   Skilled Treatment Divided attention tasks today to target cognitive linguistic skills. WIth written/reading task and auditory task (4-letter words spelled) pt was 100% accurate and 100% divided attention, with naming  5-letter words spelled, pt was 90% divided attention and 100% accurate on written/reading tasks.   Assessment / Recommendations / Plan   Plan Continue with current plan of care   Progression Toward Goals   Progression toward goals Progressing toward goals            SLP Short Term Goals - 07/20/15 1449    SLP SHORT TERM GOAL #1   Title pt to record average 3 events daily in memory notebook, 4 days a week   Time 1   Period Weeks  or 8 visits   Status Deferred   SLP SHORT TERM GOAL #2   Title pt to recall pertinent information from therapy session after 10 minute delay using compensations except writing   Time 1   Period Weeks   Status Partially Met          SLP Long Term Goals - 07/31/15 1647    SLP LONG TERM GOAL #1   Title pt to record average 3 events in memory notebook/device at least 4 days per week 75% of opportunities (weeks)   Time 5   Period --  or 16 visits   Status Deferred   SLP LONG TERM GOAL #2   Title pt will recall pertinent information after 15 minutes during therapy sessions using compensations (except writing)   Time 7   Period --  visits   Status On-going   SLP  LONG TERM GOAL #3   Title pt will demo divided attention in mod complex cognitive linguistic tasks with 90% success over three sessions   Time 7   Period --  visits   Status On-going          Plan - 07/31/15 1645    Clinical Impression Statement In tasks today, pt demo'd 95% divided attention with simple reading/written task and auditory task (SLP spelled 4 and 5-letter words). SLP to incr difficulty of tasks.   Speech Therapy Frequency 2x / week   Duration 2 weeks   Treatment/Interventions Compensatory strategies;Internal/external aids;Cognitive reorganization;SLP instruction and feedback;Patient/family education;Functional tasks   Potential to Achieve Goals Fair   Potential Considerations Severity of impairments        Problem List Patient Active Problem List   Diagnosis  Date Noted  . Calculus of gallbladder with acute on chronic cholecystitis without obstruction   . Leg weakness   . Adjustment disorder with mixed anxiety and depressed mood 11/30/2014  . FTT (failure to thrive) in adult   . Confusion   . Essential hypertension   . Sinus tachycardia ([place])   . 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 Elevated LFTs 11/26/2014  . Obesity 11/26/2014  . Anxiety 11/26/2014  . Cholelithiasis 11/26/2014    [facility] , [place], [place]  07/31/2015, 4:49 PM  [place] [address] [place], [place], [address] Phone: [phone]   Fax:  [phone]   Name: Emmalea Treanor MRN: [id number] Date of Birth: 1976-06-25

## 2015-07-31 NOTE — Patient Instructions (Signed)
   Send text message to neighbors to check on the plot of the show (during commercials) that you are watching together while doing crosswords.  SO- during the show, do the crosswords and keep one ear out for the TV to track the general plot of the show. During commercials, text your neighbor what has happened on the show so far. They can text you back if you are right of if you need corrections.

## 2015-07-31 NOTE — Patient Instructions (Signed)
Abduction: Clam (Eccentric) - Side-Lying  Chair Pose    Stand with legs hip-width apart. Inhaling, bend knees trying to keep knees over ankles, as if to sit on a chair and extend arms in front, wrists slightly lower than shoulders. Hold position for _2__ breaths. Exhaling, stand up. Repeat _10__ times. Do _2__ times per day.    DEVELOPMENTAL POSITION: Quadruped Alternate Shoulder Flexion / Hip Extension    Maintain trunk steady, raise arm and opposite leg at same time. __10_ reps per set, _2__ sets per day, _5__ days per week   Copyright  VHI. All rights reserved.     Clamshell  Lay on side with body in neutral, hips in neutral position with shoulders and ankles in the same plane.  Now bend your knees bringing your heels slightly back behind you.  with red band around legs just above knees, lift top leg up toward ceiling. hold for 5 seconds. 10 reps each leg. 1x a day.   ** Be careful not to allow your hip to roll backwards while liftng the knee **  Feet Together (Compliant Surface) Varied Arm Positions - Eyes Closed    In corner with chair in front of you for safety: Stand on compliant surface: _pillow_ with feet together and arms at sides/as needed for balance. Close eyes and visualize upright position. Hold__10__ seconds. Repeat __3-5__ times per session. Do _1-2___ sessions per day.  Copyright  VHI. All rights reserved.  Feet Together (Compliant Surface) Head Motion - Eyes Open    In corner with chair in front of you for safety: With eyes open, standing on compliant surface: _pillows_, feet together, move head:  1. slowly: up and down x 10 each way 2. Slowly: side to side, x 10 each way 3. Slowly: diagonals, both ways 10 reps  Do __1-2__ sessions per day.  Copyright  VHI. All rights reserved.   Feet Heel-Toe "Tandem" (Compliant Surface) Varied Arm Positions - Eyes Closed    Stand on compliant surface: __pillow___ with right foot directly in  front of the other and arms out. Close eyes and visualize upright position. Hold_30 seconds. Repeat _3 times each foot forward. 1-2 times a day.Single Leg (Compliant Surface) - Eyes Closed   Single Leg (Compliant Surface) - Eyes Closed    While standing on left leg on compliant surface: __pillow____, close eyes and maintain balance. Hold__30__ seconds. Repeat __3__ times each leg. Do _1-2___ sessions per day.  Copyright  VHI. All rights reserved.

## 2015-07-31 NOTE — Therapy (Signed)
[facility] [address] [place], [place], [address] Phone: [phone]   Fax:  [phone]  Physical Therapy Treatment  Patient Details  Name: [name] [name] MRN: [id number] Date of Birth: 05/23/1976 No Data Recorded  Encounter Date: 07/31/2015      PT End of Session - 07/31/15 1606    Visit Number 11   Number of Visits 18   Date for PT Re-Evaluation 08/09/15   Authorization Type [employer]   Authorization Time Period 60 combined visits   PT Start Time 1400   PT Stop Time 1445   PT Time Calculation (min) 45 min   Equipment Utilized During Treatment Gait belt   Activity Tolerance Patient tolerated treatment well      Past Medical History  Diagnosis Date  . Hypertension   . ARF (acute renal failure) ([facility]) 09/2014    admitted with electrolyte disturbance, ARF to [facility].   Marland Kitchen. Positive H. pylori test 06/2014    serum H pylori + and treated.   . 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 Laparoscopic gastric sleeve resection  06/2014    [facility]  . Endometrial ablation w/ novasure    . Tubal ligation  2002  . Esophagogastroduodenoscopy  2016    [facility] Mar[facility].  found gastritis, erosion at bypass surgery site.     There were no vitals filed for this visit.  Visit Diagnosis:  Generalized weakness  Unsteadiness      Subjective Assessment - 07/31/15 1417    Subjective No new complaints or changes in status reported by pt. Pt reports feeling stable on her feet in the home and out in the community.   Limitations Walking   Currently in Pain? No/denies   Multiple Pain Sites No     Therapeutic Exercise: To assess independence with HEP for self management in the home. Full sets of reps were not completed. Sufficient reps to assess Pt's independence were completed. HEP handout was provided to assist pt  as she requested due to not sure where her copies are located.  1. Side lying clams: Pt required no cues for lowering LE slowly.  2. Chair pose with 5 sec hold: Pt required cues to maintain knees over ankles.  3. Quadriped with alternating opposite hand and feet: Pt required no cues.  4. Corner balance with feet together with horizontal, vertical and diagonal head turns: Pt did not require more than the HEP handout.  5. Corner balance with feet together with eyes closed: Pt did not require more than the HEP handout. Eyes closed presented more appropriate challenge.  6. Corner balance with tandem stance with eyes closed: Pt did not require more than the HEP handout. Eyes closed presented more appropriate challenge. Pt unable to hold for more than 10 sec each way without UE support.   NMR: To increase pt's stability during standing ADL's and gait.  1. Double leg stance with feet shoulder width on BOSU ball in parallel bars: 1, 60sec set eyes open, 1, 60sec set eyes closed. Pt min guard assist for safety without UE support.  2. Single leg stance on BOSU ball in parallel bars: 1, 60sec set each LE with eyes open, 1, 60sec set each LE with eyes closed. Significant challenge with eyes closed. Pt with intermittent UE support.   3. Pt completed 11815ft X2 of gait  with eyes closed to challenge vestibular function and proprioception. Pt min guard assist for safety with verbal cues for directions only.      [facility] PT Assessment - 07/31/15 0001    Functional Gait  Assessment   Gait Level Surface Walks 20 ft in less than 5.5 sec, no assistive devices, good speed, no evidence for imbalance, normal gait pattern, deviates no more than 6 in outside of the 12 in walkway width.   Change in Gait Speed Able to smoothly change walking speed without loss of balance or gait deviation. Deviate no more than 6 in outside of the 12 in walkway width.   Gait with Horizontal Head Turns Performs head turns smoothly with no  change in gait. Deviates no more than 6 in outside 12 in walkway width   Gait with Vertical Head Turns Performs head turns with no change in gait. Deviates no more than 6 in outside 12 in walkway width.   Gait and Pivot Turn Pivot turns safely within 3 sec and stops quickly with no loss of balance.   Step Over Obstacle Is able to step over 2 stacked shoe boxes taped together (9 in total height) without changing gait speed. No evidence of imbalance.   Gait with Narrow Base of Support Is able to ambulate for 10 steps heel to toe with no staggering.   Gait with Eyes Closed Walks 20 ft, no assistive devices, good speed, no evidence of imbalance, normal gait pattern, deviates no more than 6 in outside 12 in walkway width. Ambulates 20 ft in less than 7 sec.   Ambulating Backwards Walks 20 ft, no assistive devices, good speed, no evidence for imbalance, normal gait   Steps Alternating feet, no rail.   Total Score 30          [facility] Adult PT Treatment/Exercise - 07/31/15 0001    Ambulation/Gait   Gait velocity 5.62ft/sec          PT Education - 07/31/15 1604    Education provided Yes   Education Details Pt educated on existing HEP and need for continuation at home. Pt also educated to go to gym with husband when she can to improve balance, endurance, strength and flexibility.   Person(s) Educated Patient   Methods Explanation   Comprehension Verbalized understanding             PT Long Term Goals - 07/31/15 1612    PT LONG TERM GOAL #1   Title Pt will be independent with HEP for strengthening and balance to decrease fall risk and increase functional strength.  (Target Date: 07/08/15)   Baseline achieved: 07/10/15   Time 4   Period Weeks   Status Achieved   PT LONG TERM GOAL #2   Title Pt will increase FGA score to 29/30 in order to indicate improvement in functional balance and score within age matched normals.   (New target Date: 08/09/15)   Baseline Assessed 07/27/15: pt scored  28/30 today. Achieved: 07/31/15 Pt scored a 30/30 on FGA.   Time 4   Period Weeks   Status Achieved   PT LONG TERM GOAL #3   Title Pt will ambulate >1000' on outdoor surfaces (paved, grass, gravel, curb, ramp, etc) without AD at independent level to indicate safe negotiation in community.  (New target Date: 08/09/15)   Baseline Achievd: 07/27/15   Time 4   Period Weeks   Status Achieved   PT LONG TERM GOAL #4   Title Pt will increase  gait speed to 3.57 ft/sec to increase efficiency of gait to more normal walking speed of 4.37 ft/sec.   (New target Date: 08/09/15   Baseline Assessed 07/27/15: Pt's gait speed has increased to 4.23ft/sec. Achieved: Pt with 5.36ft/sec gait velocity on 68m gait test.   Time 4   Period Weeks   Status Achieved               Plan - 07/31/15 1607    Clinical Impression Statement Skilled treatment session focused on checking unmet goals and challenging the pt's enitre balance system for increased stability during standing ADL's and gait and assessing independence with HEP for today's D/C plan. Pt has now achieved all goals including FGA score of 30/30 and gait velocity of 5.74ft/sec.Pt verbalized need for ongoing physical training in the home and at the gym. Pt independent with HEP.   Pt will benefit from skilled therapeutic intervention in order to improve on the following deficits Abnormal gait;Impaired UE functional use;Decreased endurance   Rehab Potential Good   PT Frequency 2x / week   PT Duration 4 weeks   PT Treatment/Interventions Balance training;Therapeutic exercise;Gait training   PT Next Visit Plan Pt D/C today: 07/31/15   PT Home Exercise Plan 06/15/15: side lying clamshell with red band, chair pose, quadruped alter. leg/arm raises, corner on pillows: narrow base EC, wide base EO head movements   Consulted and Agree with Plan of Care Patient   Family Member Consulted Husband        Problem List Patient Active Problem List   Diagnosis  Date Noted  . Calculus of gallbladder with acute on chronic cholecystitis without obstruction   . Leg weakness   . Adjustment disorder with mixed anxiety and depressed mood 11/30/2014  . FTT (failure to thrive) in adult   . Confusion   . Essential hypertension   . Sinus tachycardia ([facility])   . 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 Elevated LFTs 11/26/2014  . Obesity 11/26/2014  . Anxiety 11/26/2014  . Cholelithiasis 11/26/2014    Haze Justin 07/31/2015, 4:15 PM  Haze Justin, SPTA  Name: [name] [name] MRN: [id number] Date of Birth: 04/19/1976  This note has been reviewed and edited by supervising CI.  Sallyanne Kuster, PTA, [facility] [address] [place], [place] [address] [phone] 08/02/2015, 10:30 PM

## 2015-08-03 ENCOUNTER — Ambulatory Visit: Payer: BLUE CROSS/BLUE SHIELD | Admitting: Physical Therapy

## 2015-08-04 ENCOUNTER — Encounter: Payer: Self-pay | Admitting: Rehabilitation

## 2015-08-04 NOTE — Therapy (Signed)
[facility] [address] [place], [place], [address] Phone: [phone]   Fax:  [phone]  Patient Details  Name: [name] [name] MRN: [id number] Date of Birth: [birth year] Referring Provider:  No ref. provider found  Encounter Date: 08/04/2015    PHYSICAL THERAPY DISCHARGE SUMMARY  Visits from Start of Care: 11  Current functional level related to goals / functional outcomes:     PT Long Term Goals - 07/31/15 1612    PT LONG TERM GOAL #1   Title Pt will be independent with HEP for strengthening and balance to decrease fall risk and increase functional strength.  (Target Date: 07/08/15)   Baseline achieved: 07/10/15   Time 4   Period Weeks   Status Achieved   PT LONG TERM GOAL #2   Title Pt will increase FGA score to 29/30 in order to indicate improvement in functional balance and score within age matched normals.   (New target Date: 08/09/15)   Baseline Assessed 07/27/15: pt scored 28/30 today. Achieved: 07/31/15 Pt scored a 30/30 on FGA.   Time 4   Period Weeks   Status Achieved   PT LONG TERM GOAL #3   Title Pt will ambulate >1000' on outdoor surfaces (paved, grass, gravel, curb, ramp, etc) without AD at independent level to indicate safe negotiation in community.  (New target Date: 08/09/15)   Baseline Achievd: 07/27/15   Time 4   Period Weeks   Status Achieved   PT LONG TERM GOAL #4   Title Pt will increase gait speed to 3.57 ft/sec to increase efficiency of gait to more normal walking speed of 4.37 ft/sec.   (New target Date: 08/09/15   Baseline Assessed 07/27/15: Pt's gait speed has increased to 4.51f/sec. Achieved: Pt with 5.975fsec gait velocity on 1070mit test.   Time 4   Period Weeks   Status Achieved        Remaining deficits: Pt with very high level balance deficits, however has met all remaining LTG's and feel she is ready for D/C.    Education / Equipment: HEP and education on community  fitness.   Plan: Patient agrees to discharge.  Patient goals were met. Patient is being discharged due to meeting the stated rehab goals.  ?????        EmiCameron SprangT, MPT [facility]one: [phone]:  [phone]/01/2015, 8:21 AM

## 2015-08-05 ENCOUNTER — Ambulatory Visit: Payer: Self-pay | Admitting: Physical Therapy

## 2015-08-07 ENCOUNTER — Ambulatory Visit: Payer: BLUE CROSS/BLUE SHIELD

## 2015-08-12 ENCOUNTER — Ambulatory Visit: Payer: BLUE CROSS/BLUE SHIELD

## 2015-08-12 DIAGNOSIS — R41841 Cognitive communication deficit: Secondary | ICD-10-CM

## 2015-08-12 NOTE — Therapy (Signed)
[facility] [address] [place], [place], [address] Phone: [phone]   Fax:  [phone]  Speech Language Pathology Treatment  Patient Details  Name: [name] [name] MRN: [id number] Date of Birth: November 18, 1975 No Data Recorded  Encounter Date: 08/12/2015      End of Session - 08/12/15 1643    Visit Number 11   Number of Visits 17   Date for SLP Re-Evaluation 08/10/15   Authorization - Visit Number 10   Authorization - Number of Visits 70   SLP Start Time 6962   SLP Stop Time  1616   SLP Time Calculation (min) 41 min   Activity Tolerance Patient tolerated treatment well      Past Medical History  Diagnosis Date  . Hypertension   . ARF (acute renal failure) ([place]) 09/2014    admitted with electrolyte disturbance, ARF to [facility].   Marland Kitchen Positive H. pylori test 06/2014    serum H pylori + and treated.   . 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 Endometrial ablation w/ novasure    . Tubal ligation  2002  . Esophagogastroduodenoscopy  2016    Dr Tora Duck of [facility].  found gastritis, erosion at bypass surgery site.     There were no vitals filed for this visit.  Visit Diagnosis: Cognitive communication deficit      Subjective Assessment - 08/12/15 1541    Subjective Pt does not specifically recall completing homework (texting neighbor plot of show when doing heads down tasks). She looked in her phone and did not text friend for homework.   Currently in Pain? No/denies               ADULT SLP TREATMENT - 08/12/15 1543    General Information   Behavior/Cognition Alert;Cooperative;Pleasant mood   Treatment Provided   Treatment provided Cognitive-Linquistic   Pain Assessment   Pain Assessment No/denies pain   Cognitive-Linquistic Treatment   Treatment focused on Cognition   Skilled Treatment Pt reasoned that putting reminder in her phone would work for texting her friend for ST homework. SLP worked with divided attention tasks to improve pt's ability with that area in order to prepare for possible return to work. Dividing attention between following written directions (reading/writing) and auditory stimuli (ID'ing spelled words, simple question/answer), pt completed divided attention 50% of the time with 88% success written, 100% success auditory. Pt told SLP she thought she could have responded with >50% divided attention prior to hospitalization.   Assessment / Recommendations / Plan   Plan Continue with current plan of care  told pt to track progress with mod complex&mod complex tasks   Progression Toward Goals   Progression toward goals Progressing toward goals  track progress with mod complex div attn tasks next 2 weeks            SLP Short Term Goals - 07/20/15 1449    SLP SHORT TERM GOAL #1   Title pt to record average 3 events daily in memory notebook, 4 days a week   Time 1   Period Weeks  or 8 visits   Status Deferred   SLP SHORT TERM GOAL #2   Title pt to recall pertinent information from therapy session after 10 minute delay using compensations except writing  Time 1   Period Weeks   Status Partially Met          SLP Long Term Goals - 08/12/15 1650    SLP LONG TERM GOAL #1   Title pt to record average 3 events in memory notebook/device at least 4 days per week 75% of opportunities (weeks)   Period --  or 16 visits   Status Deferred   SLP LONG TERM GOAL #2   Title pt will recall pertinent information after 15 minutes during therapy sessions using compensations (except writing)   Time 2   Period Weeks  visits   Status On-going   SLP LONG TERM GOAL #3   Title pt will demo divided attention in mod complex cognitive linguistic tasks with 90% success over three  sessions   Time 2   Period Weeks  visits   Status On-going          Plan - 08/12/15 1643    Clinical Impression Statement In tasks today, pt demo'd 50% divided attention with mod complex reading/written task and mod complex auditory task (SLP spelled 5-letter wordsand simple question and answer). Pt and SLP to cont to work at this level and track progress for 2 more weeks and consider d/c or cont if pt progress seen.    Speech Therapy Frequency 2x / week   Duration 2 weeks   Treatment/Interventions Compensatory strategies;Internal/external aids;Cognitive reorganization;SLP instruction and feedback;Patient/family education;Functional tasks   Potential to Achieve Goals Fair   Potential Considerations Severity of impairments   Consulted and Agree with Plan of Care Patient        Problem List Patient Active Problem List   Diagnosis Date Noted  . Calculus of gallbladder with acute on chronic cholecystitis without obstruction   . Leg weakness   . Adjustment disorder with mixed anxiety and depressed mood 11/30/2014  . FTT (failure to thrive) in adult   . Confusion   . Essential hypertension   . Sinus tachycardia ([place])   . 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 Elevated LFTs 11/26/2014  . Obesity 11/26/2014  . Anxiety 11/26/2014  . Cholelithiasis 11/26/2014    [facility] , [place], [place]  08/12/2015, 4:54 PM  [facility] [address] [place], [place], [address] Phone: [phone]   Fax:  [phone]   Name: [name] [name] MRN: [id number] Date of Birth: 1976/02/12

## 2015-08-12 NOTE — Patient Instructions (Signed)
Remember to do the TV check in task with your friend for homework -- (below)  do some kind of puzzle and watch a tv show at the same time as your neighbor watching the same show. During commercials, text her the plot of the show to see if you can do the puzzle and keep up with the show at the same time. Ask her to keep you straight on the plot if you need it.

## 2015-08-14 ENCOUNTER — Ambulatory Visit: Payer: BLUE CROSS/BLUE SHIELD

## 2015-08-14 DIAGNOSIS — R41841 Cognitive communication deficit: Secondary | ICD-10-CM | POA: Diagnosis not present

## 2015-08-14 NOTE — Therapy (Signed)
[facility] [address] [place], [place], [address] Phone: [phone]   Fax:  [phone]  Speech Language Pathology Treatment  Patient Details  Name: [name] [name] MRN: [id number] Date of Birth: 04/13/1976 No Data Recorded  Encounter Date: 08/14/2015      End of Session - 08/14/15 1525    Visit Number 12   Number of Visits 17   Date for SLP Re-Evaluation 08/10/15   Authorization - Visit Number 11   Authorization - Number of Visits 3   SLP Start Time 3474   SLP Stop Time  1400   SLP Time Calculation (min) 42 min   Activity Tolerance Patient tolerated treatment well      Past Medical History  Diagnosis Date  . Hypertension   . ARF (acute renal failure) ([place]) 09/2014    admitted with electrolyte disturbance, ARF to [facility].   Marland Kitchen Positive H. pylori test 06/2014    serum H pylori + and treated.   . 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 Endometrial ablation w/ novasure    . Tubal ligation  2002  . Esophagogastroduodenoscopy  2016    Dr Tora Duck of [facility]  found gastritis, erosion at bypass surgery site.     There were no vitals filed for this visit.  Visit Diagnosis: Cognitive communication deficit      Subjective Assessment - 08/14/15 1321    Subjective Pt reports that she remembered to do homework due to having reminder in her phone. "We're still working on (divided attention).    Currently in Pain? No/denies               ADULT SLP TREATMENT - 08/14/15 1328    General Information   Behavior/Cognition Alert;Cooperative;Pleasant mood   Treatment Provided   Treatment provided Cognitive-Linquistic   Pain Assessment   Pain Assessment No/denies pain   Cognitive-Linquistic Treatment   Treatment focused on Cognition    Skilled Treatment SLP worked with divided attention tasks to improve pt's ability with that area in order to prepare for possible return to work. Dividing attention between unscrambling sentences (reading/writing) and auditory stimuli (ID'ing spelled words, simple question/answer, tracking commercials), pt completed divided attention 50% of the time with 100% success written, 95% success auditory.    Assessment / Recommendations / Plan   Plan Continue with current plan of care   Progression Toward Goals   Progression toward goals Progressing toward goals            SLP Short Term Goals - 07/20/15 1449    SLP SHORT TERM GOAL #1   Title pt to record average 3 events daily in memory notebook, 4 days a week   Time 1   Period Weeks  or 8 visits   Status Deferred   SLP SHORT TERM GOAL #2   Title pt to recall pertinent information from therapy session after 10 minute delay using compensations except writing   Time 1   Period Weeks   Status Partially Met          SLP Long Term Goals - 08/14/15 1526    SLP LONG TERM GOAL #1   Title pt to record average 3 events in memory notebook/device at least 4 days per week 75% of opportunities (weeks)  Period --  or 16 visits   Status Deferred   SLP LONG TERM GOAL #2   Title pt will recall pertinent information after 15 minutes during therapy sessions using compensations (except writing)   Time 2   Period Weeks  visits   Status On-going   SLP LONG TERM GOAL #3   Title pt will demo divided attention in mod complex cognitive linguistic tasks with 90% success over three sessions   Time 2   Period Weeks  visits   Status On-going   SLP LONG TERM GOAL #4   Title Pt will demo divided (as opposed to alternating) attention in mod complex cognitive linguistic tasks 70% of the time   Time 2   Period Weeks   Status New          Plan - 08/14/15 1525    Clinical Impression Statement In tasks today, pt demo'd 50% divided attention with  min-mod complex reading/written task and mod complex auditory task. Pt and SLP to cont to work at this level and track progress for 2 more weeks and consider d/c or cont if pt progress seen.    Speech Therapy Frequency 2x / week   Duration 2 weeks   Treatment/Interventions Compensatory strategies;Internal/external aids;Cognitive reorganization;SLP instruction and feedback;Patient/family education;Functional tasks   Potential to Achieve Goals Fair   Potential Considerations Severity of impairments        Problem List Patient Active Problem List   Diagnosis Date Noted  . Calculus of gallbladder with acute on chronic cholecystitis without obstruction   . Leg weakness   . Adjustment disorder with mixed anxiety and depressed mood 11/30/2014  . FTT (failure to thrive) in adult   . Confusion   . Essential hypertension   . Sinus tachycardia ([place])   . 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 Elevated LFTs 11/26/2014  . Obesity 11/26/2014  . Anxiety 11/26/2014  . Cholelithiasis 11/26/2014    [name] [name] , [place], [place]  08/14/2015, 3:28 PM  [facility] [address] [place], [place], [address] Phone: [phone]   Fax:  [phone]   Name: [name] [name] MRN: [id number] Date of Birth: [birth year]

## 2015-08-14 NOTE — Patient Instructions (Signed)
  We will look at progress the next two weeks to see if it's better to take a break from therapy or to continue.  If not much progress is seen in the next two weeks I will give you material to work on at home. Then, if you notice that work getting lots easier, get another prescription and come back for more therapy! If we see measurable therapy in the next two weeks we will continue for four more weeks!

## 2015-08-19 ENCOUNTER — Ambulatory Visit: Payer: BLUE CROSS/BLUE SHIELD

## 2015-08-19 DIAGNOSIS — R41841 Cognitive communication deficit: Secondary | ICD-10-CM

## 2015-08-19 NOTE — Therapy (Signed)
[facility] [address] [place], [place], [address] Phone: [phone]   Fax:  [phone]  Speech Language Pathology Treatment  Patient Details  Name: [name] [name] MRN: [id number] Date of Birth: 10/07/1975 No Data Recorded  Encounter Date: 08/19/2015      End of Session - 08/19/15 1709    Visit Number 13   Number of Visits 17   Date for SLP Re-Evaluation 08/28/15   Authorization - Visit Number 12   Authorization - Number of Visits 48   SLP Start Time 1791   SLP Stop Time  1530   SLP Time Calculation (min) 43 min   Activity Tolerance Patient tolerated treatment well      Past Medical History  Diagnosis Date  . Hypertension   . ARF (acute renal failure) ([place]) 09/2014    admitted with electrolyte disturbance, ARF to [facility].   Marland Kitchen Positive H. pylori test 06/2014    serum H pylori + and treated.   . 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 Endometrial ablation w/ novasure    . Tubal ligation  2002  . Esophagogastroduodenoscopy  2016    Dr Tora Duck of [facility].  found gastritis, erosion at bypass surgery site.     There were no vitals filed for this visit.  Visit Diagnosis: Cognitive communication deficit      Subjective Assessment - 08/19/15 1453    Subjective "I still been working on my puzzles and my TV shows." "I think (the divided attention) is about the same."   Currently in Pain? No/denies               ADULT SLP TREATMENT - 08/19/15 1456    General Information   Behavior/Cognition Alert;Cooperative;Pleasant mood   Treatment Provided   Treatment provided Cognitive-Linquistic   Cognitive-Linquistic Treatment   Treatment focused on Cognition   Skilled Treatment SLP worked with divided attention tasks to improve  pt's ability with that area in order to prepare for possible return to work. Dividing attention between mod complex reading/writing tasks and auditory stimuli (ID'ing spelled words, simple question/answer, tracking commercials), pt completed divided attention 40% of the time with 80% success with written tasks(math error), 76% success auditory (missed 4/20 commercials+ a math problem).    Assessment / Recommendations / Plan   Plan Continue with current plan of care   Progression Toward Goals   Progression toward goals Progressing toward goals            SLP Short Term Goals - 07/20/15 1449    SLP SHORT TERM GOAL #1   Title pt to record average 3 events daily in memory notebook, 4 days a week   Time 1   Period Weeks  or 8 visits   Status Deferred   SLP SHORT TERM GOAL #2   Title pt to recall pertinent information from therapy session after 10 minute delay using compensations except writing   Time 1   Period Weeks   Status Partially Met          SLP Long Term Goals - 08/19/15 1711    SLP LONG TERM GOAL #1   Title pt to record average 3 events in memory notebook/device at least 4 days per week 75% of opportunities (weeks)   Period --  or 16 visits   Status Deferred   SLP LONG TERM GOAL #2   Title pt will recall pertinent information after 15 minutes during therapy sessions using compensations (except writing)   Time 2   Period Weeks  visits   Status On-going   SLP LONG TERM GOAL #3   Title pt will demo divided attention in mod complex cognitive linguistic tasks with 90% success over three sessions   Time 2   Period Weeks  visits   Status On-going   SLP LONG TERM GOAL #4   Title Pt will demo divided (as opposed to alternating) attention in mod complex cognitive linguistic tasks 70% of the time   Time 2   Period Weeks   Status On-going          Plan - 08/19/15 1710    Clinical Impression Statement In tasks today, pt demo'd approx 40% divided attention with min-mod  complex reading/written task and mod complex auditory tasks. Pt and SLP to cont to track progress through next week and consider d/c or cont if pt progress seen.    Speech Therapy Frequency 2x / week   Duration 2 weeks   Treatment/Interventions Compensatory strategies;Internal/external aids;Cognitive reorganization;SLP instruction and feedback;Patient/family education;Functional tasks   Potential to Achieve Goals Fair   Potential Considerations Severity of impairments        Problem List Patient Active Problem List   Diagnosis Date Noted  . Calculus of gallbladder with acute on chronic cholecystitis without obstruction   . Leg weakness   . Adjustment disorder with mixed anxiety and depressed mood 11/30/2014  . FTT (failure to thrive) in adult   . Confusion   . Essential hypertension   . Sinus tachycardia ([place])   . 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 Elevated LFTs 11/26/2014  . Obesity 11/26/2014  . Anxiety 11/26/2014  . Cholelithiasis 11/26/2014    [facility] , [place], [place] 08/19/2015, 5:12 PM  [facility] [address] [place], [place], [address] Phone: [phone]   Fax:  [phone]   Name: [name] [name] MRN: [id number] Date of Birth: 1975-12-10

## 2015-08-19 NOTE — Patient Instructions (Signed)
Continue working with divided attention tasks at home.

## 2015-08-21 ENCOUNTER — Ambulatory Visit: Payer: BLUE CROSS/BLUE SHIELD

## 2015-08-26 ENCOUNTER — Ambulatory Visit: Payer: BLUE CROSS/BLUE SHIELD

## 2015-08-26 DIAGNOSIS — R41841 Cognitive communication deficit: Secondary | ICD-10-CM

## 2015-08-26 NOTE — Therapy (Signed)
[facility] [address] [place], [place], [address] Phone: [phone]   Fax:  [phone]  Speech Language Pathology Treatment  Patient Details  Name: [name] [name] MRN: [id number] Date of Birth: [birth year] No Data Recorded  Encounter Date: 08/26/2015      End of Session - 08/26/15 1430    Visit Number 14   Number of Visits 17   Date for SLP Re-Evaluation 08/28/15   Authorization - Visit Number 13   Authorization - Number of Visits 34   SLP Start Time 4562   SLP Stop Time  1230   SLP Time Calculation (min) 43 min   Activity Tolerance Patient tolerated treatment well      Past Medical History  Diagnosis Date  . Hypertension   . ARF (acute renal failure) ([place]) 09/2014    admitted with electrolyte disturbance, ARF to [facility].   Marland Kitchen Positive H. pylori test 06/2014    serum H pylori + and treated.   . 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 Endometrial ablation w/ novasure    . Tubal ligation  2002  . Esophagogastroduodenoscopy  2016    Dr Tora Duck of [facility].  found gastritis, erosion at bypass surgery site.     There were no vitals filed for this visit.  Visit Diagnosis: Cognitive communication deficit      Subjective Assessment - 08/26/15 1150    Subjective Pt reports she has been working in crossword puzzles and writing commercials down, and also watching with neighbor to check plots while doing another activity.   Currently in Pain? No/denies               ADULT SLP TREATMENT - 08/26/15 1152    General Information   Behavior/Cognition Alert;Cooperative;Pleasant mood   Treatment Provided   Treatment provided Cognitive-Linquistic   Pain Assessment   Pain Assessment No/denies pain   Cognitive-Linquistic Treatment   Treatment focused on Cognition   Skilled Treatment Pt was facilitated in divided attention tasks to improve abilities to be able to return to work. Mod complex written/reading tasks with auditory (commerical tracking, spelled words, simple question/answer) with 83% success written stimuli, 90% success auditory, and 45% divided attention. SLP chose not to do math auditory tasks due to pt completing math in written task today.   Assessment / Recommendations / Plan   Plan Continue with current plan of care            SLP Short Term Goals - 07/20/15 1449    SLP SHORT TERM GOAL #1   Title pt to record average 3 events daily in memory notebook, 4 days a week   Time 1   Period Weeks  or 8 visits   Status Deferred   SLP SHORT TERM GOAL #2   Title pt to recall pertinent information from therapy session after 10 minute delay using compensations except writing   Time 1   Period Weeks   Status Partially Met          SLP Long Term Goals - 08/26/15 1432    SLP LONG TERM GOAL #1   Title pt to record average 3 events in memory notebook/device at least 4 days per week 75% of opportunities (weeks)   Period --  or 16 visits  Status Deferred   SLP LONG TERM GOAL #2   Title pt will recall pertinent information after 15 minutes during therapy sessions using compensations (except writing)   Time 1   Period Weeks  visits   Status On-going   SLP LONG TERM GOAL #3   Title pt will demo divided attention in mod complex cognitive linguistic tasks with 90% success over three sessions   Time 1   Period Weeks  visits   Status On-going   SLP LONG TERM GOAL #4   Title Pt will demo divided (as opposed to alternating) attention in mod complex cognitive linguistic tasks 70% of the time   Time 1   Period Weeks   Status On-going          Plan - 08/26/15 1430    Clinical Impression Statement In tasks today, pt demo'd approx 45% divided attention with > simple reading/written task and >simple  auditory tasks. Pt and SLP to cont to track progress through next week and consider d/c or cont if pt progress seen. It appears as if progress does not incr pt will be d/c'd next visit.   Speech Therapy Frequency 2x / week   Duration 1 week   Treatment/Interventions Compensatory strategies;Internal/external aids;Cognitive reorganization;SLP instruction and feedback;Patient/family education;Functional tasks   Potential to Achieve Goals Fair   Potential Considerations Severity of impairments        Problem List Patient Active Problem List   Diagnosis Date Noted  . Calculus of gallbladder with acute on chronic cholecystitis without obstruction   . Leg weakness   . Adjustment disorder with mixed anxiety and depressed mood 11/30/2014  . FTT (failure to thrive) in adult   . Confusion   . Essential hypertension   . Sinus tachycardia ([place])   . 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 Elevated LFTs 11/26/2014  . Obesity 11/26/2014  . Anxiety 11/26/2014  . Cholelithiasis 11/26/2014    [facility] , [place], [place] 08/26/2015, 2:33 PM  [facility] [address] [place], [place], [address] Phone: [phone]   Fax:  [phone]   Name: [name] [name] MRN: [id number] Date of Birth: 08-26-1976

## 2015-08-28 ENCOUNTER — Ambulatory Visit: Payer: BLUE CROSS/BLUE SHIELD

## 2015-09-02 ENCOUNTER — Ambulatory Visit: Payer: BLUE CROSS/BLUE SHIELD | Attending: Family Medicine

## 2015-09-02 DIAGNOSIS — R41841 Cognitive communication deficit: Secondary | ICD-10-CM | POA: Diagnosis not present

## 2015-09-02 NOTE — Therapy (Signed)
[facility] [address] [place], [place], [address] Phone: [phone]   Fax:  [phone]  Speech Language Pathology Treatment  Patient Details  Name: [name] [name] MRN: [id number] Date of Birth: July 26, 1976 No Data Recorded  Encounter Date: 09/02/2015      End of Session - 09/02/15 1712    Visit Number 15   Number of Visits 17   Date for SLP Re-Evaluation 09/04/15   Authorization - Visit Number 32   Authorization - Number of Visits 69   SLP Start Time 1150   SLP Stop Time  1237   SLP Time Calculation (min) 47 min   Activity Tolerance Patient tolerated treatment well      Past Medical History  Diagnosis Date  . Hypertension   . ARF (acute renal failure) ([place]) 09/2014    admitted with electrolyte disturbance, ARF to [facility].   Marland Kitchen Positive H. pylori test 06/2014    serum H pylori + and treated.   . 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 Endometrial ablation w/ novasure    . Tubal ligation  2002  . Esophagogastroduodenoscopy  2016    Dr Tora Duck of [facility].  found gastritis, erosion at bypass surgery site.     There were no vitals filed for this visit.  Visit Diagnosis: Cognitive communication deficit      Subjective Assessment - 09/02/15 1152    Subjective Pt reports she has been working in crossword puzzles and writing commercials down, and also watching with neighbor to check plots while doing another activity.   Patient is accompained by: --  husband, Andric for last 10 minutes               ADULT SLP TREATMENT - 09/02/15 1153    General Information   Behavior/Cognition Alert;Cooperative;Pleasant mood   Treatment Provided   Treatment provided Cognitive-Linquistic   Pain Assessment   Pain Assessment No/denies pain    Cognitive-Linquistic Treatment   Treatment focused on Cognition   Skilled Treatment Divided attention tasks targeted with auditory and written/reading tasks, as last session. Pt with 15% divided attention, 95% success auditory tasks, 97% success written tasks.   Assessment / Recommendations / Plan   Plan Continue with current plan of care;Discharge SLP treatment due to (comment)  max rehab potential reached at this time   Progression Toward Goals   Progression toward goals --  discharge appropriate at this time. See goal update.          SLP Education - 09/02/15 1711    Education provided Yes   Education Details vocational rehab, divided attention tasks at home   Person(s) Educated Patient;Spouse   Methods Explanation;Handout   Comprehension Verbalized understanding          SLP Short Term Goals - 07/20/15 1449    SLP SHORT TERM GOAL #1   Title pt to record average 3 events daily in memory notebook, 4 days a week   Time 1   Period Weeks  or 8 visits   Status Deferred   SLP SHORT TERM GOAL #2   Title pt to recall pertinent information from therapy session after 10 minute delay using compensations except writing   Time 1   Period Weeks   Status Partially Met  SLP Long Term Goals - 09/02/15 1714    SLP LONG TERM GOAL #1   Title pt to record average 3 events in memory notebook/device at least 4 days per week 75% of opportunities (weeks)   Period --  or 16 visits   Status Deferred   SLP LONG TERM GOAL #2   Title pt will recall pertinent information after 15 minutes during therapy sessions using compensations (except writing)   Time 1   Period Weeks  visits   Status Deferred  due to focus on attention -  pt has stated in past month that memory comensations are working very well at home   SLP [place] #3   Title pt will demo divided attention in mod complex cognitive linguistic tasks with 90% success over three sessions   Time 1   Period Weeks   visits   Status Not Met   SLP LONG TERM GOAL #4   Title Pt will demo divided (as opposed to alternating) attention in mod complex cognitive linguistic tasks 70% of the time   Time 1   Period Weeks   Status Not Met          Plan - 09/02/15 1713    Clinical Impression Statement In tasks today, pt demo'd decr'd divided attention in simple to mod complex reading and written tasks. Pt's progress through two weeks has remained essentially unchanged, pt d/c'd due to max rehab potential reached at this time.    Treatment/Interventions Compensatory strategies;Internal/external aids;Cognitive reorganization;SLP instruction and feedback;Patient/family education;Functional tasks   Potential to Achieve Goals Fair   Potential Considerations Severity of impairments   Consulted and Agree with Plan of Care Patient       SPEECH THERAPY DISCHARGE SUMMARY  Visits from Start of Care: 15  Current functional level related to goals / functional outcomes: See goal update above. Pt did not ultimately meet goals for divided attention, instead she had good success with accuracy in tasks most of the time using alternating attention. She should seek a change in job placement or in job setting to alleviate some of the need to multitask.   Remaining deficits: Higher level attention deficits.   Education / Equipment: Compensations for memory, home tasks for divided attention.  Plan: Patient agrees to discharge.  Patient goals were partially met. Patient is being discharged due to                                                     ?????meeting her maximum rehab potential at this time.        Problem List Patient Active Problem List   Diagnosis Date Noted  . Calculus of gallbladder with acute on chronic cholecystitis without obstruction   . Leg weakness   . Adjustment disorder with mixed anxiety and depressed mood 11/30/2014  . FTT (failure to thrive) in adult   . Confusion   . Essential  hypertension   . Sinus tachycardia ([place])   . 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 Elevated LFTs 11/26/2014  . Obesity 11/26/2014  . Anxiety 11/26/2014  . Cholelithiasis 11/26/2014    SCHINKE,CARL , [place], [place]  09/02/2015, 5:17 PM  [facility] [address] [place], [place], [address] Phone: [phone]   Fax:  [phone]   Name: [name] [name] MRN: [id number] Date of Birth:  06/10/1976   

## 2015-09-02 NOTE — Patient Instructions (Signed)
  Divided Attention ideas - any can be done together (some will be easier and some will be harder, some may be impossible) - Vary the activities  Playing a card game either by yourself or with someone  Playing a board game with someone  Sorting playing cards  Writing down commercials on TV or the radio  Writing down news stories on TV or radio  Working outside with yard work, gardening, Catering manageretc.  [employer] City[employer]ashing dishes  Recite state names and capitals  Complete long division or 2 or 3 digit multiplication problems  Listen to a Therapist, sportsspeech online (i.e., YouTube), to online news (NPR, CNN, etc.), or to a family member, and give pertinent information from the speech, news story, or conversation after it is over  Empty the dishwasher  Complete paper/pencil puzzles (sudoku, crosswords, word search)  Complete puzzles online (www.lumosity.com -computer/tablet based, or www.constanttherapy.com - iPad based)  Therapy worksheets/exercises  Put together a puzzle  State coins needed to make a certain change amount  Have someone read a portion of a novel to you, and you give a summary    Start at a level that you are challenged by, but also where you can see some success

## 2015-09-27 IMAGING — US US ABDOMEN COMPLETE
1 series · 14 of 25 positions shown · non-contrast
Comparison: None.

CLINICAL DATA: Initial evaluation for elevated liver function tests
with nausea and constipation

EXAM:
ULTRASOUND ABDOMEN COMPLETE

[Series 1: us abdomen complete · 0.18mm/px · 14 of 72 slices shown]
[im 1/72]
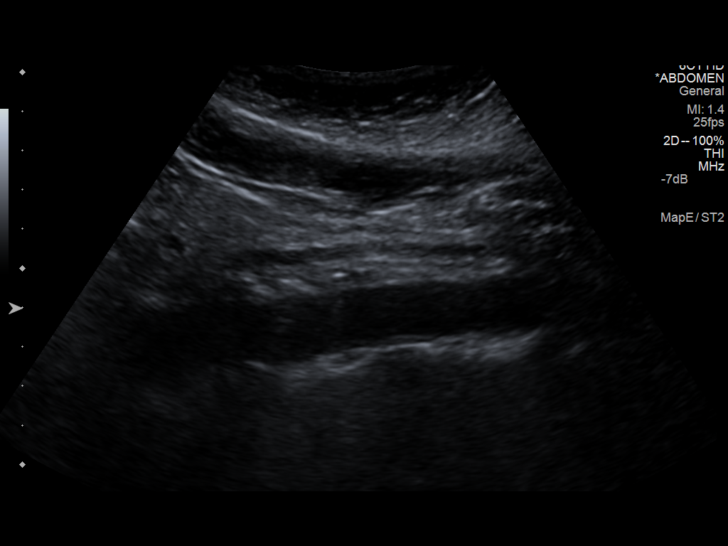
[im 6/72]
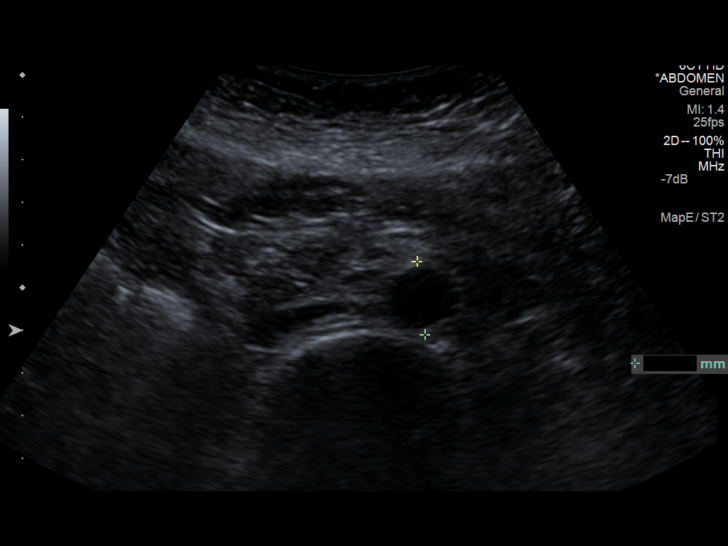
[im 12/72]
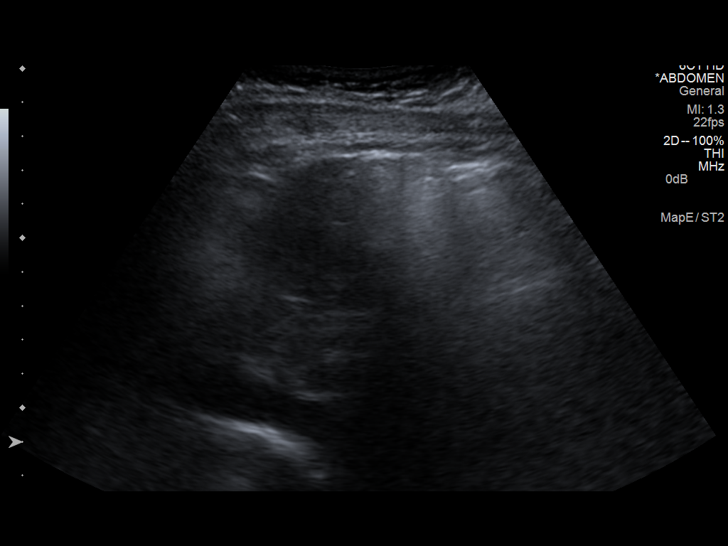
[im 18/72]
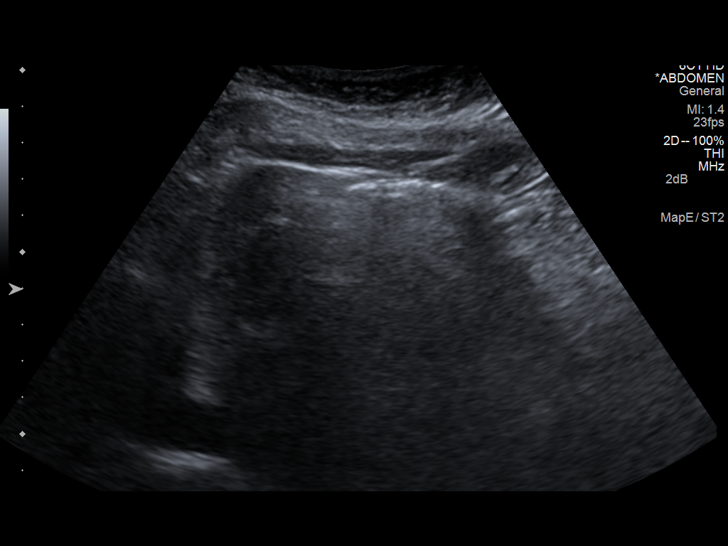
[im 24/72]
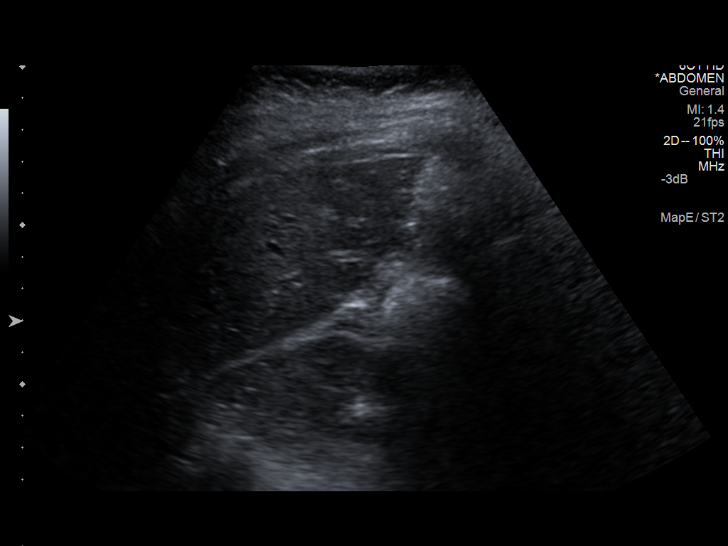
[im 27/72]
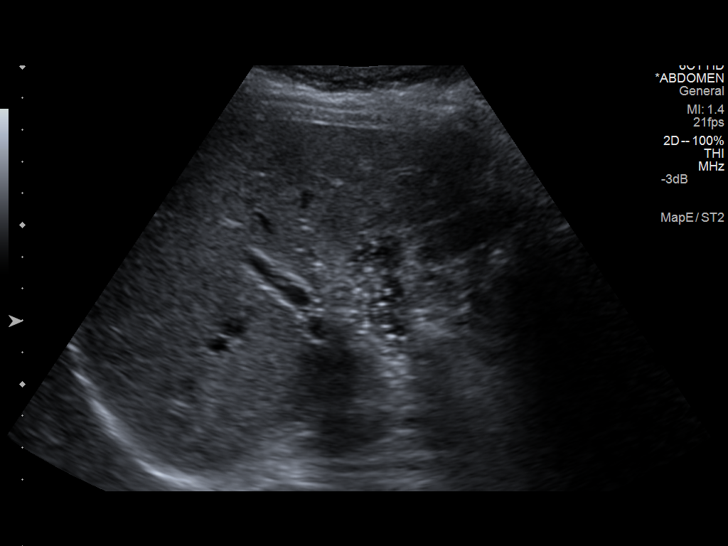
[im 33/72]
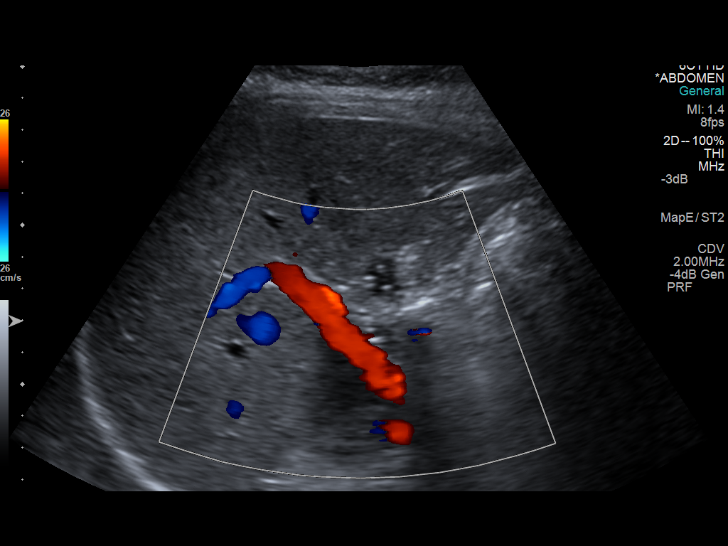
[im 39/72]
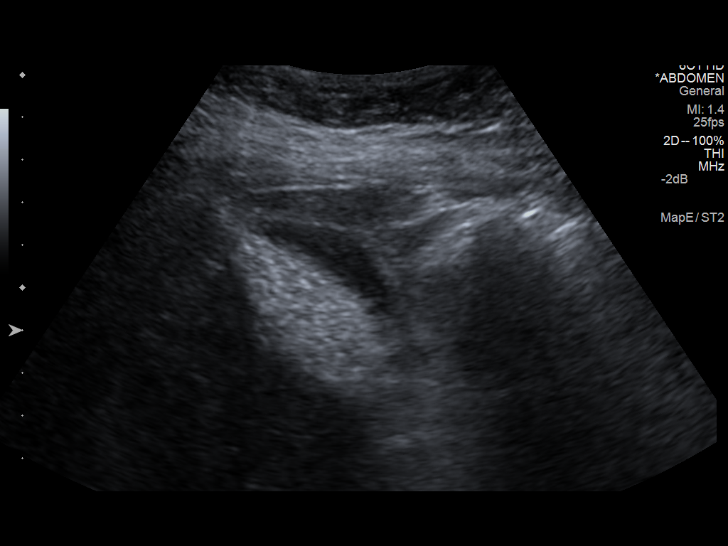
[im 45/72]
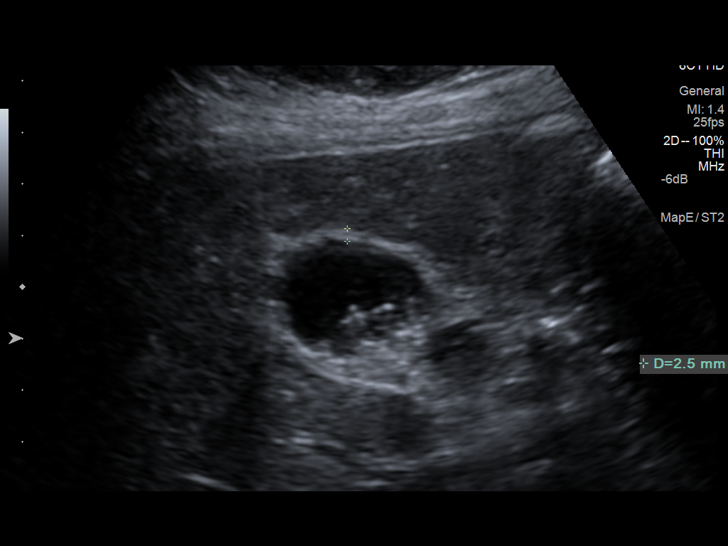
[im 48/72]
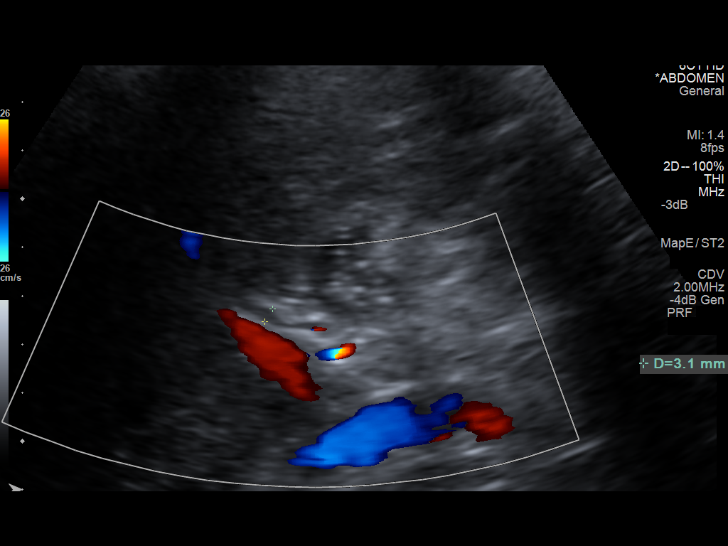
[im 54/72]
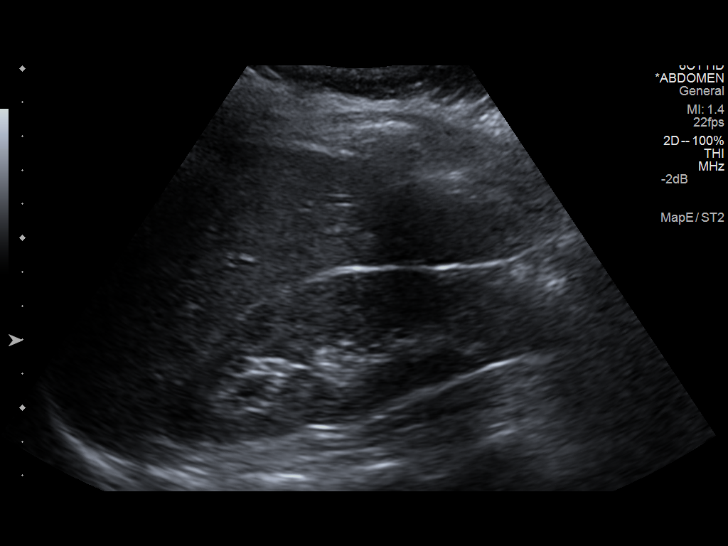
[im 60/72]
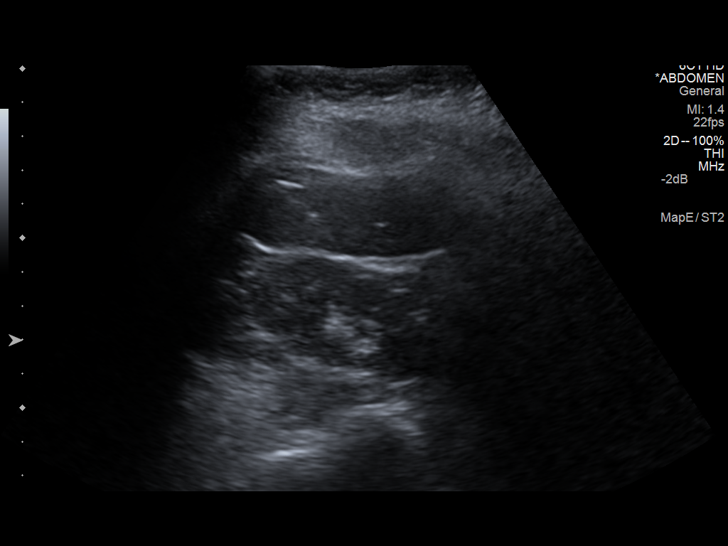
[im 66/72]
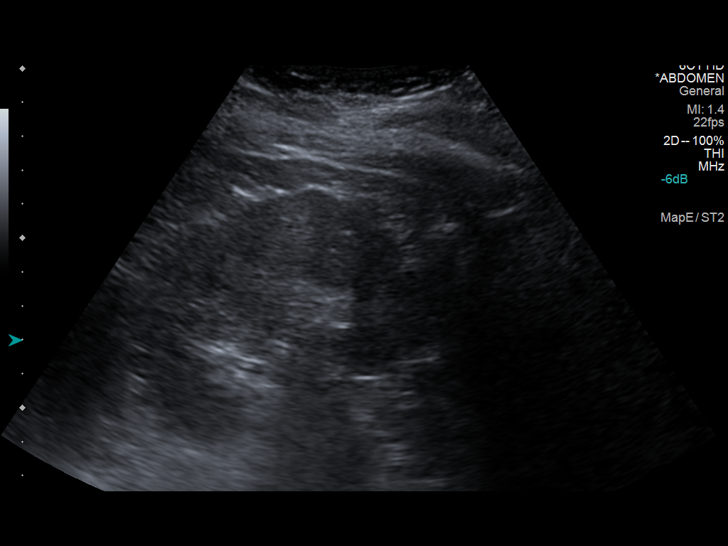
[im 72/72]
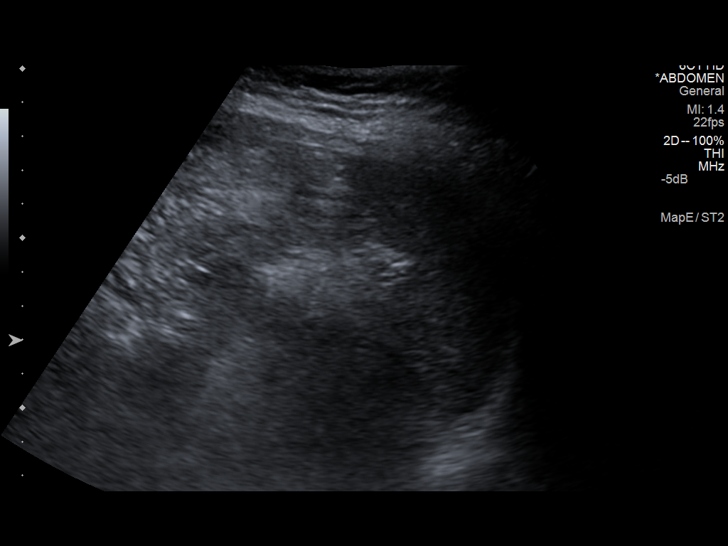

[14 of 25 positions shown; findings below may reference images not displayed]

FINDINGS: Gallbladder: There is an extremely large volume of sludge within the
lumen of the gallbladder. There may also be numerous small calculi.
Gallbladder wall thickness is within normal limits. There is no
sonographic Murphy sign.

Common bile duct: Diameter: 3 mm

Liver: No focal lesion identified. Within normal limits in
parenchymal echogenicity.

IVC: No abnormality visualized.

Pancreas: Suboptimally characterize but grossly negative

Spleen: Size and appearance within normal limits.

Right Kidney: Length: 11 cm. 15 mm cyst midpole laterally. Otherwise
negative.

Left Kidney: Length: 11 cm. Echogenicity within normal limits. No
mass or hydronephrosis visualized.

Abdominal aorta: No aneurysm visualized.

Other findings: None.
IMPRESSION: Large volume of sludge within the gallbladder lumen possibly with
tiny calculi associated. No gallbladder wall thickening or
sonographic Murphy's sign.

## 2015-09-27 IMAGING — NM NM HEPATOBILIARY IMAGE, INC GB
3 series · 13 of 13 positions shown · non-contrast
Comparison: Ultrasound 11/27/2014.

CLINICAL DATA: Gallstones.

EXAM:
NUCLEAR MEDICINE HEPATOBILIARY IMAGING WITH GALLBLADDER EF
TECHNIQUE: Sequential images of the abdomen were obtained [DATE] minutes
following intravenous administration of radiopharmaceutical. After
slow intravenous infusion of 1.48 micrograms Cholecystokinin,
gallbladder ejection fraction was determined.
RADIOPHARMACEUTICALS:  5.0 Millic[place]ie Wc-NNm Choletec

[Series 1: biliary · 4.14mm/px · 6 of 60 frames shown]
[frame 6/60]
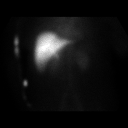
[frame 16/60]
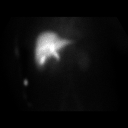
[frame 26/60]
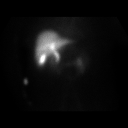
[frame 36/60]
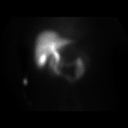
[frame 46/60]
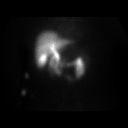
[frame 56/60]
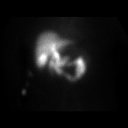

[Series 2: rt lat · 4.14mm/px · 1 of 1 slices shown]
[im 1/1]
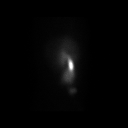

[Series 3: gbef · 4.14mm/px · 6 of 45 frames shown]
[frame 4/45]
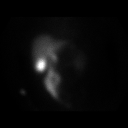
[frame 12/45]
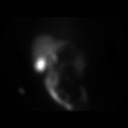
[frame 19/45]
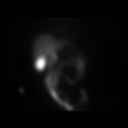
[frame 27/45]
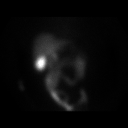
[frame 34/45]
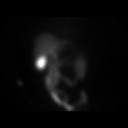
[frame 42/45]
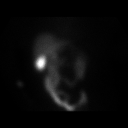

[13 of 13 positions shown; findings below may reference images not displayed]

FINDINGS: Liver, biliary system, gallbladder, bowel visualize normally.
Gallbladder ejection fraction at 45 minutes is 16%. This is abnormal
.. At 45 min, normal ejection fraction is greater than 40%. Patient
experienced no symptoms following administration of CCK.
IMPRESSION: 1. Liver, biliary system, gallbladder, bowel visualize normally.
2. Gallbladder ejection fraction is low at 16% at 45 minutes.

## 2018-09-25 ENCOUNTER — Other Ambulatory Visit: Payer: Self-pay | Admitting: Family Medicine

## 2018-09-25 DIAGNOSIS — Z1231 Encounter for screening mammogram for malignant neoplasm of breast: Secondary | ICD-10-CM

## 2018-10-26 ENCOUNTER — Ambulatory Visit: Payer: Self-pay

## 2020-03-24 ENCOUNTER — Ambulatory Visit: Payer: BLUE CROSS/BLUE SHIELD | Admitting: Neurology

## 2020-04-23 ENCOUNTER — Encounter: Payer: Self-pay | Admitting: Neurology

## 2020-04-23 ENCOUNTER — Other Ambulatory Visit: Payer: Self-pay

## 2020-04-23 ENCOUNTER — Ambulatory Visit (INDEPENDENT_AMBULATORY_CARE_PROVIDER_SITE_OTHER): Payer: BC Managed Care – PPO | Admitting: Neurology

## 2020-04-23 VITALS — BP 136/84 | HR 75 | Ht 68.0 in | Wt 173.0 lb

## 2020-04-23 DIAGNOSIS — R419 Unspecified symptoms and signs involving cognitive functions and awareness: Secondary | ICD-10-CM | POA: Diagnosis not present

## 2020-04-23 DIAGNOSIS — R413 Other amnesia: Secondary | ICD-10-CM | POA: Diagnosis not present

## 2020-04-23 NOTE — Patient Instructions (Signed)
You have complaints of memory loss: memory loss or changes in cognitive function can have many reasons and does not always mean you have dementia. Conditions that can contribute to subjective or objective memory loss include: depression, stress, poor sleep from insomnia or sleep apnea, dehydration, fluctuation in blood sugar values, thyroid or electrolyte dysfunction and certain vitamin deficiencies. Dementia can be caused by stroke, brain atherosclerosis or brain vascular disease due to vascular risk factors (smoking, high blood pressure, high cholesterol, obesity and uncontrolled diabetes), certain degenerative brain disorders (including Parkinson's disease and Multiple sclerosis) and by Alzheimer's disease or other, more rare and sometimes hereditary causes. We will do some additional testing: blood work (which we will do today) and we will do a brain scan (to compare with prior scans). We will not start medication as yet. We will also request a formal cognitive test called neuropsychological evaluation which is done by a licensed neuropsychologist. We will make a referral in that regard. We will call you with brain scan test results and monitor your symptoms.

## 2020-04-23 NOTE — Progress Notes (Signed)
Subjective:    Patient ID: [name] [name] is a 44 y.o. female.  HPI     [name] Foley, MD, PhD [facility] [address] [address] [place], [place] [address]  Dear [name] [name],   I saw your patient, [name], upon your kind request in my neurologic clinic today for initial consultation of her cognitive complaints, memory change.  The patient is unaccompanied today.  As you know, [name] [name] is a 44 year old right-handed woman with an underlying medical history of hypertension, overweight state, status post weight loss surgery, and prior history of acute renal failure, who reports problems with her memory for the past several years. She reports forgetfulness, which started after her bariatric surgery in 2015.  She believes that her forgetfulness is progressive.  She has had no difficulty with word finding, denies any mood disorder, in particular any depression or anxiety.  She is currently not taking any medication with the exception of metoprolol and a multivitamin.  She is not taking vitamin D or any B vitamins.  She sees bariatric surgery once a year.  She reports that she had a complicated course and that she required extensive rehab.  She also reports that she had been seen in this clinic in or around 2015 but there is no record of this.  She is not sure as to the provider she saw.  She also reports that she had memory therapy, unclear which clinic and if she is on neuropsychology for this. I reviewed your office note from 12/26/2019.  No treatment or evaluation for memory loss is mentioned in your note or prior memory therapy.  She had a brain MRI without contrast through [facility] on 06/29/2018 and I reviewed the results: IMPRESSION:  1.  No acute intracranial abnormality.  2.  Few small discrete white matter lesions are identified. These lesions are abnormal but nonspecific, usually resulting from benign/remote/incidental causes (e.g. Prior  trauma/inflammation/demyelinization, or chronic ischemia associated with  migraine/atherosclerosis/other vasculopathies processes).    Her main concern is forgetfulness and this affects her remote and recent memory.  She works in Clinical biochemist.  At the time of her bariatric surgery she was out of work for about a year.  She started working at the same workplace but in a different position.  She feels that she has to write more things down to remember them.  Forgetfulness has been noted by her family including her daughter.  She lives with her daughter, patient is divorced.  She does not smoke, drinks alcohol occasionally, maybe once a week, caffeine is limited to about 2 servings per day.  She tries to hydrate well with water.  She works from home currently.  She denies any confusion or disorientation but has been using her GPS more when driving.  She sleeps fairly well.  She has occasional snoring.  She denies any sudden onset of one-sided weakness or numbness or tingling or droopy face or slurring of speech.  She denies any recurrent headaches.  Her Past Medical History Is Significant For: Past Medical History:  Diagnosis Date  . ARF (acute renal failure) (HCC) 09/2014   admitted with electrolyte disturbance, ARF to [facility].   . Hypertension   . Obesity    gastric sleeve bariatric surgery 06/2015   . Positive H. pylori test 06/2014   serum H pylori + and treated.     Her Past Surgical History Is Significant For: Past Surgical History:  Procedure Laterality Date  . ENDOMETRIAL ABLATION  W/ NOVASURE    . ESOPHAGOGASTRODUODENOSCOPY  2016   Dr Mariana Kaufman of [facility].  found gastritis, erosion at bypass surgery site.   Marland Kitchen LAPAROSCOPIC GASTRIC SLEEVE RESECTION  06/2014   Dr Olin Pia of [facility]  . TUBAL LIGATION  2002    Her Family History Is Significant For: Family History  Problem Relation Age of Onset  . High blood pressure Mother   . High blood pressure  Father     Her Social History Is Significant For: Social History   Socioeconomic History  . Marital status: Single    Spouse name: Not on file  . Number of children: Not on file  . Years of education: Not on file  . Highest education level: Not on file  Occupational History  . Not on file  Tobacco Use  . Smoking status: Never Smoker  . Smokeless tobacco: Never Used  Substance and Sexual Activity  . Alcohol use: Yes    Comment: very little   . Drug use: No  . Sexual activity: Not on file  Other Topics Concern  . Not on file  Social History Narrative   Caffeine 2 cups of coffee    Right handed    Social Determinants of Health   Financial Resource Strain:   . Difficulty of Paying Living Expenses: Not on file  Food Insecurity:   . Worried About Programme researcher, broadcasting/film/video in the Last Year: Not on file  . Ran Out of Food in the Last Year: Not on file  Transportation Needs:   . Lack of Transportation (Medical): Not on file  . Lack of Transportation (Non-Medical): Not on file  Physical Activity:   . Days of Exercise per Week: Not on file  . Minutes of Exercise per Session: Not on file  Stress:   . Feeling of Stress : Not on file  Social Connections:   . Frequency of Communication with Friends and Family: Not on file  . Frequency of Social Gatherings with Friends and Family: Not on file  . Attends Religious Services: Not on file  . Active Member of Clubs or Organizations: Not on file  . Attends Banker Meetings: Not on file  . Marital Status: Not on file    Her Allergies Are:  Allergies  Allergen Reactions  . Levaquin [Levofloxacin In D5w] Rash  . Penicillins Itching and Rash    Rash on lips/itch  :   Her Current Medications Are:  Outpatient Encounter Medications as of 04/23/2020  Medication Sig  . metoprolol succinate (TOPROL-XL) 50 MG 24 hr tablet metoprolol succinate ER 50 mg tablet,extended release 24 hr  . [DISCONTINUED] Amino Acids-Protein Hydrolys  (FEEDING SUPPLEMENT, PRO-STAT SUGAR FREE 64,) LIQD Take 30 mLs by mouth 2 (two) times daily.  . [DISCONTINUED] feeding supplement, GLUCERNA SHAKE, (GLUCERNA SHAKE) LIQD Take 237 mLs by mouth 3 (three) times daily between meals.  . [DISCONTINUED] folic acid (FOLVITE) 1 MG tablet Take 1 tablet (1 mg total) by mouth daily.  . [DISCONTINUED] lactulose (CHRONULAC) 10 GM/15ML solution Take 30 mLs (20 g total) by mouth 2 (two) times daily.  . [DISCONTINUED] losartan (COZAAR) 50 MG tablet Take 1 tablet (50 mg total) by mouth daily.  . [DISCONTINUED] metoprolol tartrate (LOPRESSOR) 25 MG tablet Take 1 tablet (25 mg total) by mouth 2 (two) times daily.  . [DISCONTINUED] Multiple Vitamin (MULTIVITAMIN) LIQD Take 5 mLs by mouth daily.  . [DISCONTINUED] ondansetron (ZOFRAN) 8 MG tablet Take 1 tablet (8 mg  total) by mouth every 8 (eight) hours as needed for nausea. (Patient not taking: Reported on 11/26/2014)  . [DISCONTINUED] pantoprazole (PROTONIX) 40 MG tablet Take 1 tablet (40 mg total) by mouth daily.  . [DISCONTINUED] sertraline (ZOLOFT) 50 MG tablet Take 1 tablet (50 mg total) by mouth daily.  . [DISCONTINUED] thiamine (VITAMIN B-1) 100 MG tablet Take 1 tablet (100 mg total) by mouth daily.  . [DISCONTINUED] vitamin B-12 1000 MCG tablet Take 1 tablet (1,000 mcg total) by mouth daily.  . [DISCONTINUED] Vitamin D, Ergocalciferol, (DRISDOL) 50000 UNITS CAPS capsule Take 1 capsule (50,000 Units total) by mouth every 7 (seven) days. For 8 weeks   No facility-administered encounter medications on file as of 04/23/2020.  :   Review of Systems:  Out of a complete 14 point review of systems, all are reviewed and negative with the exception of these symptoms as listed below:  Review of Systems  Neurological:       Here for consult on memory loss.  She reports hx of weight loss(gastric sleeve) dating back to 07/28/14. She reports severe complications r/t the surgery and has struggled with her memory since.      Objective:  Neurological Exam  Physical Exam Physical Examination:   Vitals:   04/23/20 0901  BP: 136/84  Pulse: 75  SpO2: 98%    General Examination: The patient is a very pleasant 44 y.o. female in no acute distress. She appears well-developed and well-nourished and well groomed.   HEENT: Normocephalic, atraumatic, pupils are equal, round and reactive to light and accommodation. Funduscopic exam is normal with sharp disc margins noted. Extraocular tracking is good without limitation to gaze excursion or nystagmus noted. Normal smooth pursuit is noted. Hearing is grossly intact. Face is symmetric with normal facial animation and normal facial sensation. Speech is clear with no dysarthria noted. There is no hypophonia. There is no lip, neck/head, jaw or voice tremor. Neck is supple with full range of passive and active motion. There are no carotid bruits on auscultation. Oropharynx exam reveals: mild mouth dryness, good dental hygiene. Tongue protrudes centrally and palate elevates symmetrically.   Chest: Clear to auscultation without wheezing, rhonchi or crackles noted.  Heart: S1+S2+0, regular and normal without murmurs, rubs or gallops noted.   Abdomen: Soft, non-tender and non-distended with normal bowel sounds appreciated on auscultation.  Extremities: There is no pitting edema in the distal lower extremities bilaterally. Pedal pulses are intact.  Skin: Warm and dry without trophic changes noted.  Musculoskeletal: exam reveals no obvious joint deformities, tenderness or joint swelling or erythema.   Neurologically:  Mental status: The patient is awake, alert and oriented in all 4 spheres. Her immediate and remote memory, attention, language skills and fund of knowledge are fairly good. There is no evidence of aphasia, agnosia, apraxia or anomia. Speech is clear with normal prosody and enunciation. Thought process is linear. Mood is normal and affect is normal.   On  04/23/2020: MMSE: 23/30 (she missed 1 point on day of the week, one-point on exact date, 3 points on serial sevens, 2 points on remote recall), CDT: 4/4, AFT: 6/min.  Cranial nerves II - XII are as described above under HEENT exam. In addition: shoulder shrug is normal with equal shoulder height noted. Motor exam: Normal bulk, strength and tone is noted. There is no drift, tremor or rebound. Romberg is negative. Reflexes are 2+ throughout. Babinski: Toes are flexor bilaterally. Fine motor skills and coordination: intact with normal finger taps, normal  hand movements, normal rapid alternating patting, normal foot taps and normal foot agility.  Cerebellar testing: No dysmetria or intention tremor on finger to nose testing. Heel to shin is unremarkable bilaterally. There is no truncal or gait ataxia.  Sensory exam: intact to light touch, vibration, and temperature sense in the upper and lower extremities.  Gait, station and balance: She stands easily. No veering to one side is noted. No leaning to one side is noted. Posture is age-appropriate and stance is narrow based. Gait shows normal stride length and normal pace. No problems turning are noted. Tandem walk is unremarkable.           Assessment and Plan:  In summary, Haleemah Toral is a very pleasant 44 y.o.-year old female with an underlying medical history of hypertension, overweight state, status post weight loss surgery, and prior history of acute renal failure, who presents for evaluation of her memory loss of approximately 6 years duration.  She reports difficulty with her short-term and long-term memory, particularly forgetfulness.  Her memory scores are mildly abnormal.  She denies any family history of dementia.  Her neurological exam otherwise is normal and reassuring.  I suggested we proceed with more in-depth evaluation of her memory with the help of a neuropsychologist for a full cognitive evaluation.  She sees bariatrics for routine follow-up  once a year.  She has been taking her supplements but currently is only on multivitamin and no longer on any B vitamin.  The only prescription medicine is metoprolol.  She denies any underlying mood disorder.  She is agreeable to pursuing further work-up, in addition to the above I recommend blood work which we we will do today.  In addition, I would like to pursue a brain MRI with and without contrast to rule out any structural cause of her symptoms and compare with her scan from 2019.  We will monitor her symptoms and examination and I plan to follow her after testing, we will keep her posted as to her test results by phone call in the interim.  We talked about the importance of healthy lifestyle.  I answered all her questions today and she was in agreement with the plan.  Thank you very much for allowing me to participate in the care of this nice patient. If I can be of any further assistance to you please do not hesitate to call me at [phone].  Sincerely,   [name] FoleySaima Luciel Brickman, MD, PhD

## 2020-04-27 ENCOUNTER — Telehealth: Payer: Self-pay

## 2020-04-27 NOTE — Telephone Encounter (Signed)
I called pt. No answer, left a message asking pt to call me back.   

## 2020-04-27 NOTE — Progress Notes (Signed)
Please update patient regarding her blood test results.  Her vitamin D was borderline on the lower end at 29.6, that was cut off is typically 30.  In addition, her vitamin B12 was low normal, this is an important B vitamin that should be replenished.  She had stopped taking her B vitamin supplementation as I recall.  Please advise her to get in touch with her nutritionist and bariatric surgeon's office regarding the recommendations for supplementation as it is important that her vitamins are in the normal range.  She may need to start B12 injections either per bariatric clinic or primary care physician, I would encourage her to check with both her bariatric specialist and primary care physician.  We can fax labs to their offices respectively as well.

## 2020-04-27 NOTE — Telephone Encounter (Signed)
-----   Message from Huston Foley, MD sent at 04/27/2020  7:35 AM EDT ----- Please update patient regarding her blood test results.  Her vitamin D was borderline on the lower end at 29.6, that was cut off is typically 30.  In addition, her vitamin B12 was low normal, this is an important B vitamin that should be replenished.  She had stopped taking her B vitamin supplementation as I recall.  Please advise her to get in touch with her nutritionist and bariatric surgeon's office regarding the recommendations for supplementation as it is important that her vitamins are in the normal range.  She may need to start B12 injections either per bariatric clinic or primary care physician, I would encourage her to check with both her bariatric specialist and primary care physician.  We can fax labs to their offices respectively as well.

## 2020-04-28 ENCOUNTER — Telehealth: Payer: Self-pay | Admitting: Neurology

## 2020-04-28 MED ORDER — ALPRAZOLAM 0.5 MG PO TABS: ORAL_TABLET | ORAL | 0 refills | Status: DC

## 2020-04-28 NOTE — Telephone Encounter (Signed)
At 9:23 this morning pt left a voicemail returning the call to [place].

## 2020-04-28 NOTE — Telephone Encounter (Signed)
Pt called back and we reviewed her lab results. She verbalized understanding and will contact PCP/Bariatric clinic.

## 2020-04-28 NOTE — Telephone Encounter (Signed)
I spoke with the patient she is scheduled at [facility] for 05/05/20  She also informed me she is claustrophobic and would need something to her. She is aware to have a driver.

## 2020-04-28 NOTE — Telephone Encounter (Signed)
LVM for pt to call back scheduling mri  BCBS HBZJ:[id number] (exp. 04/27/20 to 10/23/20)

## 2020-04-28 NOTE — Telephone Encounter (Signed)
I have ordered Xanax for patient's upcoming MRI due to anxiety/claustrophobia reported. Please inform patient or caregiver and remind them, that she should not drive after taking Xanax and have someone take her for the MRI appointment.   

## 2020-04-28 NOTE — Addendum Note (Signed)
Addended by: Huston Foley on: 04/28/2020 04:37 PM   Modules accepted: Orders

## 2020-04-29 LAB — COMPREHENSIVE METABOLIC PANEL
ALT: 14 IU/L (ref 0–32)
AST: 17 IU/L (ref 0–40)
Albumin/Globulin Ratio: 1.3 (ref 1.2–2.2)
Albumin: 4 g/dL (ref 3.8–4.8)
Alkaline Phosphatase: 56 IU/L (ref 48–121)
BUN/Creatinine Ratio: 11 (ref 9–23)
BUN: 11 mg/dL (ref 6–24)
Bilirubin Total: 0.4 mg/dL (ref 0.0–1.2)
CO2: 24 mmol/L (ref 20–29)
Calcium: 9.1 mg/dL (ref 8.7–10.2)
Chloride: 106 mmol/L (ref 96–106)
Creatinine, Ser: 0.96 mg/dL (ref 0.57–1.00)
GFR calc Af Amer: 83 mL/min/{1.73_m2} (ref >59)
GFR calc non Af Amer: 72 mL/min/{1.73_m2} (ref >59)
Globulin, Total: 3.1 g/dL (ref 1.5–4.5)
Glucose: 79 mg/dL (ref 65–99)
Potassium: 4.4 mmol/L (ref 3.5–5.2)
Sodium: 142 mmol/L (ref 134–144)
Total Protein: 7.1 g/dL (ref 6.0–8.5)

## 2020-04-29 LAB — CBC WITH DIFFERENTIAL/PLATELET
Basophils Absolute: 0.1 10*3/uL (ref 0.0–0.2)
Basos: 1 %
EOS (ABSOLUTE): 0.1 10*3/uL (ref 0.0–0.4)
Eos: 2 %
Hematocrit: 40.6 % (ref 34.0–46.6)
Hemoglobin: 13.7 g/dL (ref 11.1–15.9)
Immature Grans (Abs): 0 10*3/uL (ref 0.0–0.1)
Immature Granulocytes: 0 %
Lymphocytes Absolute: 1.5 10*3/uL (ref 0.7–3.1)
Lymphs: 29 %
MCH: 30.1 pg (ref 26.6–33.0)
MCHC: 33.7 g/dL (ref 31.5–35.7)
MCV: 89 fL (ref 79–97)
Monocytes Absolute: 0.6 10*3/uL (ref 0.1–0.9)
Monocytes: 13 %
Neutrophils Absolute: 2.7 10*3/uL (ref 1.4–7.0)
Neutrophils: 55 %
Platelets: 252 10*3/uL (ref 150–450)
RBC: 4.55 x10E6/uL (ref 3.77–5.28)
RDW: 13.1 % (ref 11.7–15.4)
WBC: 4.9 10*3/uL (ref 3.4–10.8)

## 2020-04-29 LAB — C-REACTIVE PROTEIN: CRP: 4 mg/L (ref 0–10)

## 2020-04-29 LAB — RHEUMATOID FACTOR: Rhuematoid fact SerPl-aCnc: <10 IU/mL (ref 0.0–13.9)

## 2020-04-29 LAB — AMMONIA: Ammonia: 47 ug/dL (ref 31–155)

## 2020-04-29 LAB — VITAMIN D 25 HYDROXY (VIT D DEFICIENCY, FRACTURES): Vit D, 25-Hydroxy: 29.6 ng/mL — ABNORMAL LOW (ref 30.0–100.0)

## 2020-04-29 LAB — ANA W/REFLEX: Anti Nuclear Antibody (ANA): NEGATIVE

## 2020-04-29 LAB — HGB A1C W/O EAG: Hgb A1c MFr Bld: 5 % (ref 4.8–5.6)

## 2020-04-29 LAB — VITAMIN B1: Thiamine: 109.5 nmol/L (ref 66.5–200.0)

## 2020-04-29 LAB — VITAMIN B6: Vitamin B6: 7.8 ug/L (ref 2.0–32.8)

## 2020-04-29 LAB — B12 AND FOLATE PANEL
Folate: 14.3 ng/mL (ref >3.0)
Vitamin B-12: 196 pg/mL — ABNORMAL LOW (ref 232–1245)

## 2020-04-29 NOTE — Telephone Encounter (Signed)
I attempted to reach the pt.. Pt was not available. I left a vm asking for the pt to call back (DPR was not on file).  When pt calls back please update on Dr. Teofilo Pod instructions.

## 2020-04-30 ENCOUNTER — Encounter: Payer: BLUE CROSS/BLUE SHIELD | Admitting: Counselor

## 2020-04-30 NOTE — Progress Notes (Deleted)
NEUROPSYCHOLOGICAL EVALUATION [name] [name]  Patient Name: [name] [name] MRN: [id number] Date of Birth: 1976-03-03 Age: 44 y.o. Education: ***  Referral Circumstances and Background Information  [name] [name] is a 44 y.o., right-hand dominant, married woman with a history of HTN, obesity, and bariatric surgery in 2015 with complex course requiring rehabilitation. She has been complaining of memory problems that she thinks started after the surgery and have been progressive since then. She was referred by Dr. Frances [name] with [facility] for further evaluation of her cognitive difficulties. She documented an MMSE of 23/30 at the patient's last visit (04/23/2020).   On interview, ***  Past Medical History and Review of Relevant Studies   Patient Active Problem List   Diagnosis Date Noted  . Calculus of gallbladder with acute on chronic cholecystitis without obstruction   . Leg weakness   . Adjustment disorder with mixed anxiety and depressed mood 11/30/2014  . FTT (failure to thrive) in adult   . Confusion   . Essential hypertension   . Sinus tachycardia   . 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 Elevated LFTs 11/26/2014  . Obesity 11/26/2014  . Anxiety 11/26/2014  . Cholelithiasis 11/26/2014    Review of Neuroimaging and Relevant Medical History: The patient has an MRI brain that has not yet been obtained. She has an old MRI from 2016, that shows some very minimal areas of leukoaraiosis. There is no clear abnormality from a volume loss perspective although there is a fair amount of volume loss in the mesial temporal lobes, showing with widening of the choroid fissue and some minimal opening of the temporal horn, which is a bit more than expected give her young age.   Current Outpatient Medications  Medication Sig Dispense Refill  . ALPRAZolam (XANAX) 0.5 MG tablet Take 1-2 pills as needed on call to MRI. May take a 3rd pill if needed. 3 tablet 0  . metoprolol succinate (TOPROL-XL) 50 MG 24 hr tablet  metoprolol succinate ER 50 mg tablet,extended release 24 hr     No current facility-administered medications for this visit.    Family History  Problem Relation Age of Onset  . High blood pressure Mother   . High blood pressure Father     There {psisisno:23650} family history of dementia. There {psisisno:23650} family history of psychiatric illness.  Psychosocial History  Developmental, Educational and Employment History: ***  Psychiatric History: ***  Substance Use History: ***  Relationship History and Living Cimcumstances: ***  Mental Status and Behavioral Observations  Sensorium/Arousal: The patient's level of arousal was awake and alert. Hearing and vision were *** for testing purposes. Orientation: *** Appearance: *** Behavior: *** Speech/language: *** Gait/Posture: *** Movement: *** Social Comportment: *** Mood: *** Affect: *** Thought process/content: *** Safety: *** Insight: ***  ***  Test Procedures  Wide Range Achievement Test - 4             Word Reading Neuropsychological Assessment Battery  List Learning  Story Learning  Daily Living Memory  Naming  Digit Span Repeatable Battery for the Assessment of Neuropsychological Status (Form A)  Figure Copy  Judgment of Line Orientation  Coding  Figure Recall The Dot Counting Test A Random Letter Test Controlled Oral Word Association (F-A-S) Semantic Fluency (Animals) Trail Making Test A & B Complex Ideational Material Modified [place] Card Sorting Test Geriatric Depression Scale - Short Form Quick Dementia Rating System (completed by ***)  Plan  [name] [name] was seen for a psychiatric diagnostic evaluation and neuropsychological testing. Full  and complete note with impressions, recommendations, and interpretation of test data to follow.   [name] Boeck Roseanne Reno, PsyD, ABN Clinical Neuropsychologist  Informed Consent and Coding/Compliance  Risks and benefits of the evaluation were discussed with  the patient prior to all testing procedures. I conducted a clinical interview {psqhptesting:23835} with [name] [name] and {pstechnician:23646} administered additional test procedures. The patient was able to tolerate the testing procedures and the patient (and/or family if applicable) is likely to benefit from further follow up to receive the diagnosis and treatment recommendations, which will be rendered at the next encounter. Billing below reflects technician time, my direct face-to-face time with the patient, time spent in test administration, and time spent in professional activities including but not limited to: neuropsychological test interpretation, integration of neuropsychological test data with clinical history, report preparation, treatment planning, care coordination, and review of diagnostically pertinent medical history or studies.   Services associated with this encounter: Clinical Interview [phone]) plus *** minutes (81829; Neuropsychological Evaluation by Professional)  *** minutes (93716; Neuropsychological Evaluation by Professional, Adl.) *** minutes (96789; Test Administration by Professional) *** minutes (38101; Neuropsychological Testing by Technician) *** minutes (75102; Neuropsychological Testing by Technician, Adl.)

## 2020-05-05 ENCOUNTER — Ambulatory Visit: Payer: BC Managed Care – PPO

## 2020-05-05 DIAGNOSIS — R413 Other amnesia: Secondary | ICD-10-CM | POA: Diagnosis not present

## 2020-05-05 DIAGNOSIS — R419 Unspecified symptoms and signs involving cognitive functions and awareness: Secondary | ICD-10-CM

## 2020-05-05 MED ORDER — GADOBENATE DIMEGLUMINE 529 MG/ML IV SOLN
15.0000 mL | Freq: Once | INTRAVENOUS | Status: AC | PRN
  Administered 2020-05-05: 15 mL via INTRAVENOUS

## 2020-05-07 ENCOUNTER — Encounter: Payer: BLUE CROSS/BLUE SHIELD | Admitting: Counselor

## 2020-05-07 ENCOUNTER — Telehealth: Payer: Self-pay

## 2020-05-07 NOTE — Telephone Encounter (Signed)
-----   Message from [name] Foley, MD sent at 05/07/2020  7:43 AM EDT ----- Please call patient and advise her that her brain MRI with and without contrast was reported as normal.

## 2020-05-07 NOTE — Telephone Encounter (Signed)
I called the pt and we reviewed her MRI report.  Pt verbalized understanding and will proceed with neuropsych evaluation and keep f/u with our office as scheduled for November.

## 2020-05-07 NOTE — Progress Notes (Signed)
Please call patient and advise her that her brain MRI with and without contrast was reported as normal.

## 2020-07-06 ENCOUNTER — Encounter: Payer: Self-pay | Admitting: Neurology

## 2020-07-27 ENCOUNTER — Ambulatory Visit: Payer: BC Managed Care – PPO | Admitting: Neurology

## 2020-07-29 ENCOUNTER — Ambulatory Visit: Payer: BC Managed Care – PPO | Admitting: Neurology

## 2020-08-04 ENCOUNTER — Encounter: Payer: Self-pay | Admitting: Neurology

## 2020-08-04 ENCOUNTER — Telehealth: Payer: Self-pay | Admitting: Neurology

## 2020-08-04 ENCOUNTER — Encounter: Payer: Self-pay | Admitting: Counselor

## 2020-08-04 ENCOUNTER — Ambulatory Visit (INDEPENDENT_AMBULATORY_CARE_PROVIDER_SITE_OTHER): Payer: BC Managed Care – PPO | Admitting: Neurology

## 2020-08-04 VITALS — BP 152/98 | HR 76 | Ht 68.0 in | Wt 179.0 lb

## 2020-08-04 DIAGNOSIS — R419 Unspecified symptoms and signs involving cognitive functions and awareness: Secondary | ICD-10-CM

## 2020-08-04 DIAGNOSIS — R413 Other amnesia: Secondary | ICD-10-CM

## 2020-08-04 NOTE — Progress Notes (Signed)
Subjective:    Patient ID: [name] [name] is a 44 y.o. female.  HPI     Interim history:   [name] [name] is a 44 year old right-handed woman with an underlying medical history of hypertension, overweight state, status post weight loss surgery, and prior history of acute renal failure, who presents for follow consultation of her memory loss. The patient is accompanied by her brother today.  I first met her on 04/23/2020 at the request of her primary care PA, at which time the patient reported a several year history of short-term and long-term memory problems.  Her MMSE was 23 out of 30 at the time.  She was advised to proceed with further evaluation with blood work, brain MRI and neuropsychological evaluation.  I made a referral to neuropsychology.  She has not had this evaluation done.  Brain MRI with and without contrast from 05/05/2020 showed: IMPRESSION:    Normal MRI brain (with and without).    We called her with her test result.  Blood work including CBC with differential, vitamin B1, B12, folate, comprehensive metabolic profile, rheumatoid factor, CRP, ANA with reflex, B6, ammonia, A1c, vitamin D when the exception of a borderline vitamin D level at 29.6 and low vitamin B12 at 196.  She was advised to follow-up with nutritionist and bariatrics with regards to her supplementation.  She had stopped taking her supplements with the exception of a multivitamin.  Today, 08/04/20: she reports feeling unchanged.  She had not started any vitamin supplementation other than her multivitamin she was taking before.  She did acknowledge getting the phone call about the labs being abnormal.  She has an appointment with bariatrics today.  She has not started any additional over-the-counter supplements.  She is willing to reschedule her neuropsychology consultation appointment.  We will provide phone number to Dr. Les Pou office.  The patient's allergies, current medications, family history, past medical history,  past social history, past surgical history and problem list were reviewed and updated as appropriate.   Previously:   04/23/20: (She) reports problems with her memory for the past several years. She reports forgetfulness, which started after her bariatric surgery in 2015.  She believes that her forgetfulness is progressive.  She has had no difficulty with word finding, denies any mood disorder, in particular any depression or anxiety.  She is currently not taking any medication with the exception of metoprolol and a multivitamin.  She is not taking vitamin D or any B vitamins.  She sees bariatric surgery once a year.  She reports that she had a complicated course and that she required extensive rehab.  She also reports that she had been seen in this clinic in or around 2015 but there is no record of this.  She is not sure as to the provider she saw.  She also reports that she had memory therapy, unclear which clinic and if she is on neuropsychology for this. I reviewed your office note from 12/26/2019.  No treatment or evaluation for memory loss is mentioned in your note or prior memory therapy.   She had a brain MRI without contrast through [facility] on 06/29/2018 and I reviewed the results: IMPRESSION:  1.  No acute intracranial abnormality.  2.  Few small discrete white matter lesions are identified. These lesions are abnormal but nonspecific, usually resulting from benign/remote/incidental causes (e.g. Prior trauma/inflammation/demyelinization, or chronic ischemia associated with  migraine/atherosclerosis/other vasculopathies processes).     Her main concern is forgetfulness and this affects her remote  and recent memory.  She works in Therap[employer], art.  At the time of her bariatric surgery she was out of work for about a year.  She started working at the same workplace but in a different position.  She feels that she has to write more things down to remember them.  Forgetfulness has been noted by her  family including her daughter.  She lives with her daughter, patient is divorced.  She does not smoke, drinks alcohol occasionally, maybe once a week, caffeine is limited to about 2 servings per day.  She tries to hydrate well with water.  She works from home currently.  She denies any confusion or disorientation but has been using her GPS more when driving.  She sleeps fairly well.  She has occasional snoring.  She denies any sudden onset of one-sided weakness or numbness or tingling or droopy face or slurring of speech.  She denies any recurrent headaches.  Her Past Medical History Is Significant For: Past Medical History:  Diagnosis Date  . ARF (acute renal failure) ([place]) 09/2014   admitted with electrolyte disturbance, ARF to [facility].   . Hypertension   . Obesity    gastric sleeve bariatric surgery 06/2015   . Positive H. pylori test 06/2014   serum H pylori + and treated.     Her Past Surgical History Is Significant For: Past Surgical History:  Procedure Laterality Date  . ENDOMETRIAL ABLATION W/ NOVASURE    . ESOPHAGOGASTRODUODENOSCOPY  2016   Dr Tora Duck of [facility].  found gastritis, erosion at bypass surgery site.   Marland Kitchen LAPAROSCOPIC GASTRIC SLEEVE RESECTION  06/2014   Dr Heron Nay of [facility]  . TUBAL LIGATION  2002    Her Family History Is Significant For: Family History  Problem Relation Age of Onset  . High blood pressure Mother   . High blood pressure Father     Her Social History Is Significant For: Social History   Socioeconomic History  . Marital status: Single    Spouse name: Not on file  . Number of children: Not on file  . Years of education: Not on file  . Highest education level: Not on file  Occupational History  . Not on file  Tobacco Use  . Smoking status: Never Smoker  . Smokeless tobacco: Never Used  Substance and Sexual Activity  . Alcohol use: Yes    Comment: very little   . Drug use: No  . Sexual activity: Not on  file  Other Topics Concern  . Not on file  Social History Narrative   Caffeine 2 cups of coffee    Right handed    Social Determinants of Health   Financial Resource Strain:   . Difficulty of Paying Living Expenses: Not on file  Food Insecurity:   . Worried About Charity fundraiser in the Last Year: Not on file  . Ran Out of Food in the Last Year: Not on file  Transportation Needs:   . Lack of Transportation (Medical): Not on file  . Lack of Transportation (Non-Medical): Not on file  Physical Activity:   . Days of Exercise per Week: Not on file  . Minutes of Exercise per Session: Not on file  Stress:   . Feeling of Stress : Not on file  Social Connections:   . Frequency of Communication with Friends and Family: Not on file  . Frequency of Social Gatherings with Friends and Family: Not on file  . Attends  Religious Services: Not on file  . Active Member of Clubs or Organizations: Not on file  . Attends Archivist Meetings: Not on file  . Marital Status: Not on file    Her Allergies Are:  Allergies  Allergen Reactions  . Levaquin [Levofloxacin In D5w] Rash  . Penicillins Itching and Rash    Rash on lips/itch  :   Her Current Medications Are:  Outpatient Encounter Medications as of 08/04/2020  Medication Sig  . busPIRone (BUSPAR) 15 MG tablet Take 7.5-15 mg by mouth 2 (two) times daily.  . metoprolol succinate (TOPROL-XL) 50 MG 24 hr tablet metoprolol succinate ER 50 mg tablet,extended release 24 hr  . [DISCONTINUED] ALPRAZolam (XANAX) 0.5 MG tablet Take 1-2 pills as needed on call to MRI. May take a 3rd pill if needed.   No facility-administered encounter medications on file as of 08/04/2020.  :  Review of Systems:  Out of a complete 14 point review of systems, all are reviewed and negative with the exception of these symptoms as listed below:      Review of Systems  Neurological:       Pt presents today to discuss her memory. Pt reports that her memory  is not any better. Her neuropsych appt was cancelled but she has not gotten a return call from them.    Objective:  Neurological Exam  Physical Exam Physical Examination:   Vitals:   08/04/20 0738  BP: (!) 152/98  Pulse: 76   General Examination: The patient is a very pleasant 44 y.o. female in no acute distress. She appears well-developed and well-nourished and well groomed.   HEENT: Normocephalic, atraumatic, face is symmetric. Speech is clear with no dysarthria noted. There is no hypophonia. There are no carotid bruits on auscultation.  Chest: Clear to auscultation without wheezing, rhonchi or crackles noted.  Heart: S1+S2+0, regular and normal without murmurs, rubs or gallops noted.   Abdomen: Soft, non-tender and non-distended.  Extremities: There is no obv. edema in the distal lower extremities bilaterally.   Skin: Warm and dry without trophic changes noted.  Musculoskeletal: exam reveals no obvious joint deformities.   Neurologically:  Mental status: The patient is awake, alert and conversant. Speech is clear with normal prosody and enunciation. Thought process is linear. Mood is normal and affect is normal.   On 04/23/2020: MMSE: 23/30 (she missed 1 point on day of the week, one-point on exact date, 3 points on serial sevens, 2 points on remote recall), CDT: 4/4, AFT: 6/min.  Cranial nerves II - XII are as described above under HEENT exam. Equal shoulder height noted. Motor exam: Normal bulk, moves all 4 extremities. There is no obvious tremor.  Sensory exam: intact to light touch.   Assessment and Plan:  In summary, [name] [name] is a very pleasant 44 year old female with an underlying medical history of hypertension, overweight state, status post weight loss surgery, and prior history of acute renal failure, who presents for follow-up consultation of her memory loss of several years duration.  She reports difficulty with short-term and long-term memory and  forgetfulness.  Brain MRI was normal thankfully, labs showed low vitamin D which was in the borderline low range and low vitamin B12.  She had been advised of these test results and I advised her to follow-up with her nutritionist and bariatric specialist for supplementation of these important vitamins.  She has not started any additional vitamins and is taking a multivitamin as before.  She is strongly  advised to make a follow-up appointment with her primary care physician for ongoing surveillance of her vitamin deficiencies and also an appointment with her nutritionist and bariatric specialist.  Furthermore, she is advised to reschedule her missed neuropsychology evaluation appointment.  We provided the phone number to Dr. Les Pou office.  I suggested follow-up after she has had her evaluation through Dr. Les Pou office.  She can call us to schedule that follow-up appointment.  I answered all the questions today and the patient and her mother were in agreement.

## 2020-08-04 NOTE — Telephone Encounter (Signed)
Neuropsychology referral placed again.  I called pt, no answer, left a detailed message on cell phone, per DPR, advising her of this.

## 2020-08-04 NOTE — Telephone Encounter (Signed)
Pt called, referral for neuropsychologist has expired. Need a new referral neuropsychologist. Would like a call from the nurse.

## 2020-08-04 NOTE — Patient Instructions (Addendum)
Please call Dr. Marca Ancona office (neuropsychologist) at [facility] to reschedule your neuropsych appointment. Their phone number is [phone].  As discussed in August after your blood work, your vitamin D level was mildly below normal and vitamin B12 level was low.  Please follow-up with your primary care physician and/or nutritionist/bariatric specialist for surveillance of your B vitamins and vitamin D.  I would recommend you make an appointment as soon as possible to start replenishing these vitamins.  Your brain MRI was normal thankfully.  We can make a follow-up appointment in this office after you have completed your appointments with Dr. Roseanne Reno.  Please call us to make the appointment here.

## 2020-09-17 ENCOUNTER — Encounter: Payer: Self-pay | Admitting: Counselor

## 2020-09-17 ENCOUNTER — Ambulatory Visit (INDEPENDENT_AMBULATORY_CARE_PROVIDER_SITE_OTHER): Payer: BC Managed Care – PPO | Admitting: Counselor

## 2020-09-17 ENCOUNTER — Other Ambulatory Visit: Payer: Self-pay

## 2020-09-17 DIAGNOSIS — E639 Nutritional deficiency, unspecified: Secondary | ICD-10-CM

## 2020-09-17 DIAGNOSIS — Z9884 Bariatric surgery status: Secondary | ICD-10-CM

## 2020-09-17 DIAGNOSIS — F09 Unspecified mental disorder due to known physiological condition: Secondary | ICD-10-CM
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
## 2020-09-17 NOTE — Progress Notes (Signed)
NEUROPSYCHOLOGICAL TEST SCORES [facility]  Patient Name: [name] [name] MRN: [id number] Date of Birth: 05/01/1976 Age: 45 y.o. Education: 12 years  Measurement properties of test scores: IQ, Index, and Standard Scores (SS): Mean = 100; Standard Deviation = 15 Scaled Scores (Ss): Mean = 10; Standard Deviation = 3 Z scores (Z): Mean = 0; Standard Deviation = 1 T scores (T); Mean = 50; Standard Deviation = 10  TEST SCORES:    Note: This summary of test scores accompanies the interpretive report and should not be interpreted by unqualified individuals or in isolation without reference to the report. Test scores are relative to age, gender, and educational history as available and appropriate.   Performance Validity        [place]ell 15 and Recognition: Raw Descriptor      Free Recall 12 Within Expectation      Recognition 12 ---      False Positives 0 ---      Combined Score 24 Within Expectation      The Dot Counting Test: Raw Descriptor      E-Score 9 Within Expectation      Embedded Measures: Raw Descriptor      RBANS Effort Index: 0 Within Expectation      WAIS-IV Reliable Digit Span 8 Within Expectation      WAIS-IV Reliable Digit Span Revised 11 Within Expectation      Expected Functioning        Wide Range Achievement Test (Word Reading): Standard/Scaled Score Percentile       Word Reading 90 25      Reynolds Intellectual Screening Test Standard/T-score Percentile      Guess What 44 27      Odd Item Out 49 46  RIST Index 95 37      Cognitive Testing        RBANS, Form : Standard/Scaled Score Percentile  Total Score 80 9  Immediate Memory 76 5      List Learning 6 9      Story Memory 5 5  Visuospatial/Constructional 96 39      Figure Copy   (18) 10 50      Judgment of Line Orientation   (16) --- 26-50  Language 97 42      Picture Naming --- 17-25      Semantic Fluency 9 37  Attention 75 5      Digit Span 6 9      Coding 6 9  Delayed Memory 81 10       List Recall   (0) --- <2      List Recognition   (19) --- 17-25      Story Recall   (5) 4 2      Figure Recall   (10) 6 9      Wechsler Adult Intelligence Scale - IV: Standard/Scaled Score Percentile  Working Memory Index 86 18      Digit Span 8 25          Digit Span Forward 10 50          Digit Span Backward 8 25          Digit Span Sequencing 7 16      Arithmetic 7 16  Processing Speed Index 86 18      Symbol Search 5 5      Coding 10 50      Neuropsychological Assessment Battery (Language Module): T-score Percentile  Naming   (29) 43 24      Verbal Fluency: T-score Percentile      Controlled Oral Word Association (F-A-S) 50 50      Semantic Fluency (Animals) 51 54      Trail Making Test: T-Score Percentile      Part A 55 69      Part B 60 84      Modified Wisconsin Card Sorting Test (MWCST): Standard/T-Score Percentile      Number of Categories Correct 56 73      Number of Perseverative Errors 63 91      Number of Total Errors 62 88      Percent Perseverative Errors 61 86  Executive Function Composite 116 86      Boston Diagnostic Aphasia Exam: Raw Score Scaled Score      Complex Ideational Material 10 7      Rating Scales         Raw Score Descriptor  Patient Health Questionnaire - 9 2 Within Normal Limits  GAD-7 0 Within Normal Limits    Peter V. Roseanne Reno PsyD, ABN Clinical Neuropsychologist

## 2020-09-22 ENCOUNTER — Encounter: Payer: Self-pay | Admitting: Counselor

## 2020-09-22 ENCOUNTER — Ambulatory Visit (INDEPENDENT_AMBULATORY_CARE_PROVIDER_SITE_OTHER): Payer: BC Managed Care – PPO | Admitting: Counselor

## 2020-09-22 ENCOUNTER — Other Ambulatory Visit: Payer: Self-pay

## 2020-09-22 DIAGNOSIS — F04 Amnestic disorder due to known physiological condition: Secondary | ICD-10-CM | POA: Insufficient documentation

## 2020-09-22 DIAGNOSIS — Z9884 Bariatric surgery status: Secondary | ICD-10-CM | POA: Diagnosis not present

## 2020-09-22 NOTE — Progress Notes (Signed)
   NEUROPSYCHOLOGY FEEDBACK NOTE [facility]  Feedback Note: I met with Aja Tufaro to review the findings resulting from her neuropsychological evaluation. Since the last appointment, she has been about the same. She was attended by her brother Ronalee Belts. Time was spent reviewing the impressions and recommendations that are detailed in the evaluation report. 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 I was candid with her that I find her imaging and test data supportive of that impression. I was also candid that she is considered disabled from the standpoint of not being able to achieve competitive employment, she had to take a demotion and could not return to her pre injury job. We discussed the spectrum of cognitive severity and that compared to the range of severity possible in this condition, her deficits are mild. I took time to explain the findings and answer all the patient's questions. I encouraged Ms. Toops to contact me should she have any further questions or if further follow up is desired.   Current Medications and Medical History   Current Outpatient Medications  Medication Sig Dispense Refill  . busPIRone (BUSPAR) 15 MG tablet Take 7.5-15 mg by mouth 2 (two) times daily.    . metoprolol succinate (TOPROL-XL) 50 MG 24 hr tablet metoprolol succinate ER 50 mg tablet,extended release 24 hr     No current facility-administered medications for this visit.    Patient Active Problem List   Diagnosis Date Noted  . Calculus of gallbladder with acute on chronic cholecystitis without obstruction   . Leg weakness   . Adjustment disorder with mixed anxiety and depressed mood 11/30/2014  . FTT (failure to thrive) in adult   . Confusion   . Essential hypertension   . Sinus tachycardia   . 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 Elevated LFTs 11/26/2014  . Obesity 11/26/2014  . Anxiety 11/26/2014  .  Cholelithiasis 11/26/2014    Mental Status and Behavioral Observations  Renne Alden presented on time to the present encounter and was alert and generally oriented. Speech was normal in rate, rhythm, volume, and prosody. Self-reported mood was "I'm in a good mood" and affect was euthymic. Thought process was logical and goal-oriented and thought content was appropriate. There were no safety concerns identified at today's encounter, such as thoughts of harming self or others.   Plan  Feedback provided regarding the patient's neuropsychological evaluation. 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 I reinforced the importance of following up with her nutritional issues (she is supplementing B12 as recommended by Dr. Rexene Alberts). I also stressed Dr. Guadelupe Sabin recommendation that she consider consulting with a nutritionist. We discussed compensatory strategies, and I recommended a copy of the memory book described in the patient instructions. Loreen Atiyeh was encouraged to contact me if any questions arise or if further follow up is desired.   Viviano Simas Nicole Kindred, PsyD, ABN Clinical Neuropsychologist  Service(s) Provided at This Encounter: 31 minutes [phone]; Conjoint therapy with patient present)

## 2020-09-22 NOTE — Progress Notes (Signed)
NEUROPSYCHOLOGICAL EVALUATION [facility]  Patient Name: [name] [name] MRN: [id number] Date of Birth: February 10, 1976 Age: 45 y.o. Education: 12 years  Clinical Impressions  [name] [name] is a 45 y.o., right-hand dominant, divorced woman with a history of HTN and bariatric surgery in 2016 with prolonged nausea and vomiting leading to poor p/o intake culminating in a hospitalization at [facility] in April, 2016. She is complaining of memory problems since then, and she was not able to return to competitive employment (she was able to go back to work at her old employer, but she had to take a lesser position because she could not keep up with the demands of her previous position). She was referred by Dr. Frances Furbish who is working her up for her memory problems. Review of the patient's hospital records at the time she presented to [facility] with AMS shows that she had wide-based and unsteady gait and Dr. Ezzard Standing thought her presentation was consistent with a Wernicke's type encephalopathy. She has neuroimaging from before that episode in 2016 and again recently, and I do question if there isn't some volume loss in the mamillary bodies.   On neuropsychological testing, there is evidence of average range overall premorbid abilities and performances well below that on memory measures. She demonstrated unusually low immediate recall performance on verbally mediated measures and extremely low delayed free recall. Nevertheless, she is retaining some information across time and she did improve with recognition cuing, suggesting a bit of residual capacity. There were scattered low scores in a couple other areas including on measures of processing speed and attention. She did well on measures of visuospatial abilities, executive abilities, and language abilities.   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 This pattern of findings is highly consistent with expectations in the  setting of wernicke-korsakoff syndrome, although compared to the spectrum of injury observable in that condition, her deficits are fairly mild. These deficits are still impactful, however, and I feel that should she wish to pursue it, the findings do support disability status given that she was unable to maintain competitive employment at her previous level of skill. She is doing well compensating with calendars, reminders and other external strategies. She was once again reminded to reach out to bariatric surgery/PCP regarding supplementation for her various vitamin deficiencies. I see no reason why she is not safe to drive given that she reports no problems and is competent using GPS.   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 You had the most difficulty on verbal measures, and performed below expectations for you on measures of both immediate and delayed recall. You are still retaining some information over time and do benefit from recognition cuing, which is positive and suggests remaining memory skills. You also had some low scores in other areas, such as on measures of processing speed and select measures of attention.   In terms of the cause of your problems, certain nutritional deficiencies can cause various brain syndromes. Amongst the most notorious of these syndromes is Wernicke-Korsakoff syndrome, which is caused by a deficiency of Thiamin, a B vitamin. This typically presents with a period of confusion, gait abnormalities, and other signs/symptoms. If the condition is rapidly treated, then it is possible that no lasting damage will occur, although if treatment is delayed or in certain individuals,  it can cause permanent problems with memory and thinking.  There was concern about this syndrome back in April, 2016 when you were admitted to [facility] and you were treated for that condition, which is likely why you are doing better than many people who have this condition. Nevertheless, I do think that you have residual deficits and that this is the cause of your cognitive decline.   Wernicke-Korsakoff syndrome most classically presents with memory problems, which was the main finding on your test data. Further strengthening my impression that this is the cause of your problem, you have brain imaging from before your period of confusion and after your period of confusion. I believe that there is shrinkage in the mamillary bodies, a very small part of the brain that are important in circuits responsible for memory formation. These structures can be damaged in Wernicke-Korsakoff synedrome and this often causes them to shrink.   In terms of your employment status, I think it is great that you have been able to return to work. I would also note that it is my opinion that you are disabled from the perspective of being able to maintain competitive employment. You were not able to regain your old position, because it was too challenging, and your neuropsychological test data support the impression that you have cognitive difficulties that would be expected to interfere with your ability to work.   I would encourage you to actively monitor your nutritional status, work with bariatric surgery, your PCP, and perhaps a nutritionist on making sure you are getting sufficient nutrients. Malabsorbtion is common following bariatric surgery.   The following compensatory strategies may be helpful for managing day-to-day memory symptoms: . Minimize distractions and interruptions to the extent possible. Be an active observer, present-minded, and focus attention. Focus on only one task for a period of time.  . Get organized. Establish routines and stick to them. Make and use  checklists.  . Use external memory aids as needed, such as a planner and notebook. Repetition, written reminders, and keeping a calendar of appointments may be helpful. Charlene Brooke a place to keep your keys, wallet, cell phone, and other personal belongings.  . Break down tasks into smaller steps to help get started and to keep from feeling overwhelmed.  . Increase your success learning information by breaking it into manageable chunks, connecting it to previously learned information, or forming associations with what you are trying to remember.   You might consider picking up a copy of Improving Your Memory: How to Remember What You're Starting to Forget, by Felipa Evener and Letta Kocher, [employer], 2005; effectiveness of using this book may be enhanced with individual counseling reviewing the related behavioral and cognitive strategies.   Test Findings  Test scores are summarized in additional documentation associated with this encounter. Test scores are relative to age, gender, and educational history as available and appropriate. There were no concerns about performance validity as all findings fell within normal expectations.   General Intellectual Functioning/Achievement:  Performance on single word reading was average and performance on the RIST index was average, with relatively comparable scores on the verbally and visually mediated portions of the test. Average therefore presents as a reasonable standard of comparison for this patient's cognitive test data.   Attention and Processing Efficiency: Performance on indicators of attention and processing efficiency was variable, suggesting some difficulties. On the Working Memory Index from the WAIS-IV, she scored at a reasonable low average level overall, although  her performance on the Attention index of the RBANS was unusually low. She performed in the average range on one measure of digit repetition forward and in the  unusually low range on another. Average performance was demonstrated on digit repetition backwards. Digit resequencing in ascending order and mental solving of arithmetical word problems were low average.   Performance on measures of processing speed was variable. She achieved a reasonable low average range score on the processing speed index of the WAIS-IV. Nevertheless, she scored in the unusually low range on 2/3 measures of processing speed performance administered including on one measure of timed number-symbol coding and on another measure of efficient visual matching and efficient visual scanning. Performance was average on one additional measure of timed number-symbol coding.   Language: Language findings reflected reasonable scores with an average performance on the Language Index of the RBANS. Visual object confrontation naming was within normal limits on two different measures. Timed indicators of word generation revealed average performance in response to two-different category prompts and in response to the letters F-A-S.   Visuospatial Function: Performance on visuospatial/constructional measures was average. Copy of a line drawing was average and judgment of angular line orientations was average.   Learning and Memory: Performance on measures of learning and memory was low, with primary difficulties on verbal measures. She is retaining some information across time and did benefit from recognition cuing, suggesting some residual capacity.   In the verbal realm, [name] [name] performed at an unusually low level when learning verbal information including a 10-item word list and short story. She did not retain any of the words from the word list and retained only a couple of the details from the short story on delayed-recall, generating extremely low scores. She did benefit from recognition cuing with words from the word list, however, with low average to average range performance.   In the  visual realm, delayed recall for a modestly complex figure was unusually low.   Executive Functions: Performance on executive measures was generally good with normal scores on all indicators. She did very well, with a high average score on the Modified [employer] Composite. Alternating sequencing of numbers and letters of the alphabet was high average. Reasoning with verbal information was low average, still within normal limits, on the Complex Ideational Material. Generation of words in response to the letters F-A-S was average.   Rating Scale(s): [name] [name] denied clinically significant levels of depression or anxiety on self-rating scales.   Bettye Boeck Roseanne Reno PsyD, ABN Clinical Neuropsychologist

## 2020-09-22 NOTE — Patient Instructions (Signed)
Your performance and presentation on neuropsychological testing was suggestive of diminished performance on memory measures and in some other areas. You had the most difficulty on verbal measures, and performed below expectations for you on measures of both immediate and delayed recall. You are still retaining some information over time and do benefit from recognition cuing, which is positive and suggests remaining memory skills. You also had some low scores in other areas, such as on measures of processing speed and select measures of attention.   In terms of the cause of your problems, certain nutritional deficiencies can cause various brain syndromes. Amongst the most notorious of these syndromes is Wernicke-Korsakoff syndrome, which is caused by a deficiency of Thiamin, a B vitamin. This typically presents with a period of confusion, gait abnormalities, and other signs/symptoms. If the condition is rapidly treated, then it is possible that no lasting damage will occur, although if treatment is delayed or in certain individuals, it can cause permanent problems with memory and thinking. There was concern about this syndrome back in April, 2016 when you were admitted to [facility] and you were treated for that condition, which is likely why you are doing better than many people who have this condition. Nevertheless, I do think that you have residual deficits and that this is the cause of your cognitive decline.   Wernicke-Korsakoff syndrome most classically presents with memory problems, which was the main finding on your test data. Further strengthening my impression that this is the cause of your problem, you have brain imaging from before your period of confusion and after your period of confusion. I believe that there is shrinkage in the mamillary bodies, a very small part of the brain that are important in circuits responsible for memory formation. These structures can be damaged in Wernicke-Korsakoff  synedrome and this often causes them to shrink.   In terms of your employment status, I think it is great that you have been able to return to work. I would also note that it is my opinion that you are disabled from the perspective of being able to maintain competitive employment. You were not able to regain your old position, because it was too challenging, and your neuropsychological test data support the impression that you have cognitive difficulties that would be expected to interfere with your ability to work.   I would encourage you to actively monitor your nutritional status, work with bariatric surgery, your PCP, and perhaps a nutritionist on making sure you are getting sufficient nutrients. Malabsorbtion is common following bariatric surgery.   The following compensatory strategies may be helpful for managing day-to-day memory symptoms:  Minimize distractions and interruptions to the extent possible. Be an active observer, present-minded, and focus attention. Focus on only one task for a period of time.   Get organized. Establish routines and stick to them. Make and use checklists.   Use external memory aids as needed, such as a planner and notebook. Repetition, written reminders, and keeping a calendar of appointments may be helpful.  Designate a place to keep your keys, wallet, cell phone, and other personal belongings.   Break down tasks into smaller steps to help get started and to keep from feeling overwhelmed.   Increase your success learning information by breaking it into manageable chunks, connecting it to previously learned information, or forming associations with what you are trying to remember.   You might consider picking up a copy of Improving Your Memory: How to Remember What You're Starting to Forget,  by Felipa Evener and Letta Kocher, [facility], 2005; effectiveness of using this book may be enhanced with individual counseling reviewing the  related behavioral and cognitive strategies.

## 2021-09-16 ENCOUNTER — Telehealth: Payer: Self-pay | Admitting: Psychology

## 2021-09-16 NOTE — Telephone Encounter (Signed)
Returned patients call and informed her that she is not established with a Neurologist here and she will need to contact her PCP. Explained to patient that Dr. Roseanne Reno and Dr. Milbert Coulter are our neurocognitive physicians and do not fill out Parking placards. Informed patient that she needs to contact her PCP back. Patient verbalized understanding.

## 2021-09-16 NOTE — Telephone Encounter (Signed)
Pt called stating she has been forgetting things lately and tried to apply for handicap parking.  She needs paperwork for this.  She wanted to see if since she came here last year she could get that.

## 2022-06-06 ENCOUNTER — Ambulatory Visit (HOSPITAL_BASED_OUTPATIENT_CLINIC_OR_DEPARTMENT_OTHER): Admit: 2022-06-06 | Payer: BC Managed Care – PPO | Admitting: Obstetrics and Gynecology

## 2022-06-06 ENCOUNTER — Encounter (HOSPITAL_BASED_OUTPATIENT_CLINIC_OR_DEPARTMENT_OTHER): Payer: Self-pay

## 2022-06-06 SURGERY — HYSTERECTOMY, TOTAL, ROBOT-ASSISTED
Anesthesia: General

## 2022-12-07 LAB — AMB RESULTS CONSOLE CBG: Glucose: 108

## 2023-11-14 ENCOUNTER — Other Ambulatory Visit (HOSPITAL_COMMUNITY): Payer: Self-pay

## 2023-11-14 MED ORDER — METOPROLOL SUCCINATE ER 50 MG PO TB24
50.0000 mg | ORAL_TABLET | Freq: Every day | ORAL | 1 refills | Status: AC
  Filled 2023-11-14: qty 30, 30d supply, fill #0

## 2023-11-14 MED ORDER — TRAMADOL HCL 50 MG PO TABS
50.0000 mg | ORAL_TABLET | Freq: Four times a day (QID) | ORAL | 0 refills | Status: AC | PRN
  Filled 2023-11-14: qty 30, 8d supply, fill #0

## 2023-11-15 ENCOUNTER — Other Ambulatory Visit (HOSPITAL_COMMUNITY): Payer: Self-pay

## 2023-11-20 ENCOUNTER — Other Ambulatory Visit (HOSPITAL_COMMUNITY): Payer: Self-pay

## 2023-11-20 MED ORDER — METRONIDAZOLE 500 MG PO TABS
500.0000 mg | ORAL_TABLET | Freq: Two times a day (BID) | ORAL | 0 refills | Status: DC
  Filled 2023-11-20: qty 14, 7d supply, fill #0

## 2024-04-30 ENCOUNTER — Other Ambulatory Visit (HOSPITAL_COMMUNITY): Payer: Self-pay

## 2024-04-30 MED ORDER — METOPROLOL SUCCINATE ER 50 MG PO TB24
50.0000 mg | ORAL_TABLET | Freq: Every day | ORAL | 0 refills | Status: AC
  Filled 2024-04-30: qty 30, 30d supply, fill #0

## 2024-05-14 ENCOUNTER — Other Ambulatory Visit (HOSPITAL_COMMUNITY): Payer: Self-pay

## 2024-05-14 MED ORDER — TRAMADOL HCL 50 MG PO TABS
50.0000 mg | ORAL_TABLET | Freq: Four times a day (QID) | ORAL | 0 refills | Status: DC | PRN
  Filled 2024-05-14: qty 30, 8d supply, fill #0

## 2024-05-16 ENCOUNTER — Other Ambulatory Visit (HOSPITAL_COMMUNITY): Payer: Self-pay

## 2024-05-16 MED ORDER — TRAMADOL HCL 50 MG PO TABS
50.0000 mg | ORAL_TABLET | Freq: Four times a day (QID) | ORAL | 0 refills | Status: AC | PRN
  Filled 2024-05-16: qty 30, 8d supply, fill #0

## 2024-05-16 MED ORDER — METOPROLOL SUCCINATE ER 50 MG PO TB24
50.0000 mg | ORAL_TABLET | Freq: Every day | ORAL | 1 refills | Status: AC
  Filled 2024-05-16: qty 30, 30d supply, fill #0
  Filled 2024-06-24 – 2024-06-25 (×2): qty 30, 30d supply, fill #1

## 2024-06-25 ENCOUNTER — Telehealth (HOSPITAL_COMMUNITY): Payer: Self-pay

## 2024-06-25 ENCOUNTER — Other Ambulatory Visit: Payer: Self-pay

## 2024-06-25 ENCOUNTER — Other Ambulatory Visit (HOSPITAL_COMMUNITY): Payer: Self-pay

## 2024-07-02 ENCOUNTER — Other Ambulatory Visit (HOSPITAL_COMMUNITY): Payer: Self-pay
